# Patient Record
Sex: Female | Born: 1940 | Race: White | Hispanic: No | Marital: Married | Smoking: Never smoker
Health system: Southern US, Community
[De-identification: ages and names within clinical notes are randomized; demographics above are authoritative.]

## PROBLEM LIST (undated history)

## (undated) DIAGNOSIS — E785 Hyperlipidemia, unspecified: Secondary | ICD-10-CM

## (undated) DIAGNOSIS — K219 Gastro-esophageal reflux disease without esophagitis: Secondary | ICD-10-CM

## (undated) DIAGNOSIS — B029 Zoster without complications: Secondary | ICD-10-CM

## (undated) DIAGNOSIS — I10 Essential (primary) hypertension: Secondary | ICD-10-CM

## (undated) DIAGNOSIS — C801 Malignant (primary) neoplasm, unspecified: Secondary | ICD-10-CM

## (undated) DIAGNOSIS — T7840XA Allergy, unspecified, initial encounter: Secondary | ICD-10-CM

## (undated) DIAGNOSIS — K5900 Constipation, unspecified: Secondary | ICD-10-CM

## (undated) DIAGNOSIS — Z923 Personal history of irradiation: Secondary | ICD-10-CM

## (undated) DIAGNOSIS — M1990 Unspecified osteoarthritis, unspecified site: Secondary | ICD-10-CM

## (undated) DIAGNOSIS — F419 Anxiety disorder, unspecified: Secondary | ICD-10-CM

## (undated) HISTORY — DX: Zoster without complications: B02.9

## (undated) HISTORY — DX: Personal history of irradiation: Z92.3

## (undated) HISTORY — DX: Unspecified osteoarthritis, unspecified site: M19.90

## (undated) HISTORY — DX: Essential (primary) hypertension: I10

## (undated) HISTORY — DX: Anxiety disorder, unspecified: F41.9

## (undated) HISTORY — DX: Allergy, unspecified, initial encounter: T78.40XA

## (undated) HISTORY — DX: Hyperlipidemia, unspecified: E78.5

## (undated) HISTORY — DX: Malignant (primary) neoplasm, unspecified: C80.1

## (undated) HISTORY — DX: Constipation, unspecified: K59.00

## (undated) HISTORY — DX: Gastro-esophageal reflux disease without esophagitis: K21.9

---

## 1964-01-04 HISTORY — PX: APPENDECTOMY: SHX54

## 1993-01-03 HISTORY — PX: TOTAL ABDOMINAL HYSTERECTOMY: SHX209

## 1999-01-04 DIAGNOSIS — Z923 Personal history of irradiation: Secondary | ICD-10-CM

## 1999-01-04 HISTORY — DX: Personal history of irradiation: Z92.3

## 1999-01-04 HISTORY — PX: BREAST LUMPECTOMY: SHX2

## 2006-07-31 ENCOUNTER — Encounter: Admission: RE | Admit: 2006-07-31 | Discharge: 2006-07-31 | Payer: Self-pay | Admitting: Internal Medicine

## 2007-08-01 ENCOUNTER — Encounter: Admission: RE | Admit: 2007-08-01 | Discharge: 2007-08-01 | Payer: Self-pay | Admitting: Internal Medicine

## 2008-08-05 ENCOUNTER — Encounter: Admission: RE | Admit: 2008-08-05 | Discharge: 2008-08-05 | Payer: Self-pay | Admitting: Internal Medicine

## 2009-05-11 ENCOUNTER — Ambulatory Visit: Payer: Self-pay | Admitting: Oncology

## 2009-05-11 ENCOUNTER — Ambulatory Visit: Payer: Self-pay | Admitting: Genetic Counselor

## 2009-05-13 LAB — CBC WITH DIFFERENTIAL/PLATELET
BASO%: 0.5 % (ref 0.0–2.0)
EOS%: 1.1 % (ref 0.0–7.0)
Eosinophils Absolute: 0.1 10*3/uL (ref 0.0–0.5)
HCT: 40.4 % (ref 34.8–46.6)
MCHC: 34.7 g/dL (ref 31.5–36.0)
MONO#: 0.6 10*3/uL (ref 0.1–0.9)
MONO%: 8.1 % (ref 0.0–14.0)
Platelets: 218 10*3/uL (ref 145–400)
RBC: 4.33 10*6/uL (ref 3.70–5.45)
WBC: 7.3 10*3/uL (ref 3.9–10.3)
lymph#: 2.2 10*3/uL (ref 0.9–3.3)

## 2009-05-13 LAB — CANCER ANTIGEN 27.29: CA 27.29: 29 U/mL (ref 0–39)

## 2009-08-18 ENCOUNTER — Encounter: Admission: RE | Admit: 2009-08-18 | Discharge: 2009-08-18 | Payer: Self-pay | Admitting: Family Medicine

## 2010-04-23 ENCOUNTER — Other Ambulatory Visit: Payer: Self-pay | Admitting: Family Medicine

## 2010-04-23 DIAGNOSIS — Z1231 Encounter for screening mammogram for malignant neoplasm of breast: Secondary | ICD-10-CM

## 2010-08-23 ENCOUNTER — Ambulatory Visit
Admission: RE | Admit: 2010-08-23 | Discharge: 2010-08-23 | Disposition: A | Discharge status: Home / Self Care | Payer: Medicare Other | Source: Ambulatory Visit | Attending: Family Medicine | Admitting: Family Medicine

## 2010-08-23 DIAGNOSIS — Z1231 Encounter for screening mammogram for malignant neoplasm of breast: Secondary | ICD-10-CM

## 2011-04-20 ENCOUNTER — Other Ambulatory Visit: Payer: Self-pay | Admitting: Family Medicine

## 2011-04-20 DIAGNOSIS — Z1231 Encounter for screening mammogram for malignant neoplasm of breast: Secondary | ICD-10-CM

## 2011-08-24 ENCOUNTER — Ambulatory Visit
Admission: RE | Admit: 2011-08-24 | Discharge: 2011-08-24 | Disposition: A | Discharge status: Home / Self Care | Payer: Medicare Other | Source: Ambulatory Visit | Attending: Family Medicine | Admitting: Family Medicine

## 2011-08-24 DIAGNOSIS — Z1231 Encounter for screening mammogram for malignant neoplasm of breast: Secondary | ICD-10-CM

## 2012-04-17 ENCOUNTER — Other Ambulatory Visit: Payer: Self-pay

## 2012-04-17 DIAGNOSIS — Z1231 Encounter for screening mammogram for malignant neoplasm of breast: Secondary | ICD-10-CM

## 2012-08-28 ENCOUNTER — Ambulatory Visit
Admission: RE | Admit: 2012-08-28 | Discharge: 2012-08-28 | Disposition: A | Discharge status: Home / Self Care | Payer: Medicare Other | Source: Ambulatory Visit

## 2012-08-28 DIAGNOSIS — Z1231 Encounter for screening mammogram for malignant neoplasm of breast: Secondary | ICD-10-CM

## 2013-01-10 ENCOUNTER — Other Ambulatory Visit: Payer: Self-pay | Admitting: Family Medicine

## 2013-01-10 DIAGNOSIS — R7989 Other specified abnormal findings of blood chemistry: Secondary | ICD-10-CM

## 2013-01-10 DIAGNOSIS — R945 Abnormal results of liver function studies: Principal | ICD-10-CM

## 2013-01-14 ENCOUNTER — Ambulatory Visit
Admission: RE | Admit: 2013-01-14 | Discharge: 2013-01-14 | Disposition: A | Discharge status: Home / Self Care | Payer: No Typology Code available for payment source | Source: Ambulatory Visit | Attending: Family Medicine | Admitting: Family Medicine

## 2013-01-14 DIAGNOSIS — R7989 Other specified abnormal findings of blood chemistry: Secondary | ICD-10-CM

## 2013-01-14 DIAGNOSIS — R945 Abnormal results of liver function studies: Principal | ICD-10-CM

## 2013-01-24 ENCOUNTER — Other Ambulatory Visit: Payer: Self-pay | Admitting: Family Medicine

## 2013-01-24 DIAGNOSIS — R935 Abnormal findings on diagnostic imaging of other abdominal regions, including retroperitoneum: Secondary | ICD-10-CM

## 2013-02-04 ENCOUNTER — Ambulatory Visit
Admission: RE | Admit: 2013-02-04 | Discharge: 2013-02-04 | Disposition: A | Discharge status: Home / Self Care | Payer: No Typology Code available for payment source | Source: Ambulatory Visit | Attending: Family Medicine | Admitting: Family Medicine

## 2013-02-04 DIAGNOSIS — R935 Abnormal findings on diagnostic imaging of other abdominal regions, including retroperitoneum: Secondary | ICD-10-CM

## 2013-02-04 MED ORDER — GADOBENATE DIMEGLUMINE 529 MG/ML IV SOLN
20.0000 mL | Freq: Once | INTRAVENOUS | Status: AC | PRN
  Administered 2013-02-04: 20 mL via INTRAVENOUS

## 2013-03-26 ENCOUNTER — Other Ambulatory Visit: Payer: Self-pay

## 2013-03-26 DIAGNOSIS — Z1231 Encounter for screening mammogram for malignant neoplasm of breast: Secondary | ICD-10-CM

## 2013-04-18 ENCOUNTER — Other Ambulatory Visit: Payer: Self-pay | Admitting: Family Medicine

## 2013-04-18 DIAGNOSIS — M810 Age-related osteoporosis without current pathological fracture: Secondary | ICD-10-CM

## 2013-08-30 ENCOUNTER — Ambulatory Visit
Admission: RE | Admit: 2013-08-30 | Discharge: 2013-08-30 | Disposition: A | Discharge status: Home / Self Care | Payer: Medicare Other | Source: Ambulatory Visit

## 2013-08-30 ENCOUNTER — Ambulatory Visit
Admission: RE | Admit: 2013-08-30 | Discharge: 2013-08-30 | Disposition: A | Discharge status: Home / Self Care | Payer: Medicare Other | Source: Ambulatory Visit | Attending: Family Medicine | Admitting: Family Medicine

## 2013-08-30 DIAGNOSIS — M810 Age-related osteoporosis without current pathological fracture: Secondary | ICD-10-CM

## 2013-08-30 DIAGNOSIS — Z1231 Encounter for screening mammogram for malignant neoplasm of breast: Secondary | ICD-10-CM

## 2014-05-05 ENCOUNTER — Other Ambulatory Visit: Payer: Self-pay

## 2014-05-05 DIAGNOSIS — Z1231 Encounter for screening mammogram for malignant neoplasm of breast: Secondary | ICD-10-CM

## 2014-09-02 ENCOUNTER — Ambulatory Visit
Admission: RE | Admit: 2014-09-02 | Discharge: 2014-09-02 | Disposition: A | Discharge status: Home / Self Care | Payer: Medicare Other | Source: Ambulatory Visit

## 2014-09-02 DIAGNOSIS — Z1231 Encounter for screening mammogram for malignant neoplasm of breast: Secondary | ICD-10-CM

## 2014-09-04 ENCOUNTER — Other Ambulatory Visit: Payer: Self-pay | Admitting: Family Medicine

## 2014-09-04 DIAGNOSIS — R928 Other abnormal and inconclusive findings on diagnostic imaging of breast: Secondary | ICD-10-CM

## 2014-09-12 ENCOUNTER — Other Ambulatory Visit: Payer: Self-pay | Admitting: Family Medicine

## 2014-09-12 ENCOUNTER — Ambulatory Visit
Admission: RE | Admit: 2014-09-12 | Discharge: 2014-09-12 | Disposition: A | Discharge status: Home / Self Care | Payer: Medicare Other | Source: Ambulatory Visit | Attending: Family Medicine | Admitting: Family Medicine

## 2014-09-12 DIAGNOSIS — R928 Other abnormal and inconclusive findings on diagnostic imaging of breast: Secondary | ICD-10-CM

## 2014-09-12 DIAGNOSIS — N6320 Unspecified lump in the left breast, unspecified quadrant: Secondary | ICD-10-CM

## 2014-09-19 ENCOUNTER — Ambulatory Visit
Admission: RE | Admit: 2014-09-19 | Discharge: 2014-09-19 | Disposition: A | Discharge status: Home / Self Care | Payer: Medicare Other | Source: Ambulatory Visit | Attending: Family Medicine | Admitting: Family Medicine

## 2014-09-19 ENCOUNTER — Other Ambulatory Visit: Payer: Self-pay | Admitting: Family Medicine

## 2014-09-19 DIAGNOSIS — N6320 Unspecified lump in the left breast, unspecified quadrant: Secondary | ICD-10-CM

## 2014-09-23 ENCOUNTER — Other Ambulatory Visit: Payer: Self-pay | Admitting: Oncology

## 2015-02-09 ENCOUNTER — Other Ambulatory Visit: Payer: Self-pay | Admitting: Family Medicine

## 2015-02-09 DIAGNOSIS — N6320 Unspecified lump in the left breast, unspecified quadrant: Secondary | ICD-10-CM

## 2015-02-16 DIAGNOSIS — E785 Hyperlipidemia, unspecified: Secondary | ICD-10-CM | POA: Diagnosis not present

## 2015-02-16 DIAGNOSIS — I10 Essential (primary) hypertension: Secondary | ICD-10-CM | POA: Diagnosis not present

## 2015-02-16 DIAGNOSIS — E782 Mixed hyperlipidemia: Secondary | ICD-10-CM | POA: Diagnosis not present

## 2015-02-16 DIAGNOSIS — Z8542 Personal history of malignant neoplasm of other parts of uterus: Secondary | ICD-10-CM | POA: Diagnosis not present

## 2015-02-16 DIAGNOSIS — Z853 Personal history of malignant neoplasm of breast: Secondary | ICD-10-CM | POA: Diagnosis not present

## 2015-03-23 ENCOUNTER — Ambulatory Visit
Admission: RE | Admit: 2015-03-23 | Discharge: 2015-03-23 | Disposition: A | Discharge status: Home / Self Care | Payer: Medicare Other | Source: Ambulatory Visit | Attending: Family Medicine | Admitting: Family Medicine

## 2015-03-23 ENCOUNTER — Other Ambulatory Visit: Payer: Self-pay | Admitting: Family Medicine

## 2015-03-23 DIAGNOSIS — N6320 Unspecified lump in the left breast, unspecified quadrant: Secondary | ICD-10-CM

## 2015-03-23 DIAGNOSIS — N63 Unspecified lump in breast: Secondary | ICD-10-CM | POA: Diagnosis not present

## 2015-03-26 ENCOUNTER — Other Ambulatory Visit: Payer: Medicare Other

## 2015-03-31 ENCOUNTER — Ambulatory Visit
Admission: RE | Admit: 2015-03-31 | Discharge: 2015-03-31 | Disposition: A | Discharge status: Home / Self Care | Payer: Medicare Other | Source: Ambulatory Visit | Attending: Family Medicine | Admitting: Family Medicine

## 2015-03-31 ENCOUNTER — Other Ambulatory Visit: Payer: Self-pay | Admitting: Family Medicine

## 2015-03-31 DIAGNOSIS — N6320 Unspecified lump in the left breast, unspecified quadrant: Secondary | ICD-10-CM

## 2015-03-31 DIAGNOSIS — N63 Unspecified lump in breast: Secondary | ICD-10-CM | POA: Diagnosis not present

## 2015-03-31 DIAGNOSIS — N641 Fat necrosis of breast: Secondary | ICD-10-CM | POA: Diagnosis not present

## 2015-08-19 ENCOUNTER — Other Ambulatory Visit: Payer: Self-pay | Admitting: Family Medicine

## 2015-08-19 DIAGNOSIS — N6320 Unspecified lump in the left breast, unspecified quadrant: Secondary | ICD-10-CM

## 2015-08-20 DIAGNOSIS — Z0000 Encounter for general adult medical examination without abnormal findings: Secondary | ICD-10-CM | POA: Diagnosis not present

## 2015-08-20 DIAGNOSIS — E785 Hyperlipidemia, unspecified: Secondary | ICD-10-CM | POA: Diagnosis not present

## 2015-08-20 DIAGNOSIS — R7301 Impaired fasting glucose: Secondary | ICD-10-CM | POA: Diagnosis not present

## 2015-08-20 DIAGNOSIS — I10 Essential (primary) hypertension: Secondary | ICD-10-CM | POA: Diagnosis not present

## 2015-08-20 DIAGNOSIS — Z8542 Personal history of malignant neoplasm of other parts of uterus: Secondary | ICD-10-CM | POA: Diagnosis not present

## 2015-08-20 DIAGNOSIS — F411 Generalized anxiety disorder: Secondary | ICD-10-CM | POA: Diagnosis not present

## 2015-08-20 DIAGNOSIS — Z853 Personal history of malignant neoplasm of breast: Secondary | ICD-10-CM | POA: Diagnosis not present

## 2015-08-21 ENCOUNTER — Ambulatory Visit
Admission: RE | Admit: 2015-08-21 | Discharge: 2015-08-21 | Disposition: A | Discharge status: Home / Self Care | Payer: Medicare Other | Source: Ambulatory Visit | Attending: Family Medicine | Admitting: Family Medicine

## 2015-08-21 ENCOUNTER — Other Ambulatory Visit: Payer: Self-pay | Admitting: Family Medicine

## 2015-08-21 DIAGNOSIS — I10 Essential (primary) hypertension: Secondary | ICD-10-CM

## 2015-08-21 DIAGNOSIS — R918 Other nonspecific abnormal finding of lung field: Secondary | ICD-10-CM | POA: Diagnosis not present

## 2015-10-02 ENCOUNTER — Ambulatory Visit
Admission: RE | Admit: 2015-10-02 | Discharge: 2015-10-02 | Disposition: A | Discharge status: Home / Self Care | Payer: Medicare Other | Source: Ambulatory Visit | Attending: Family Medicine | Admitting: Family Medicine

## 2015-10-02 DIAGNOSIS — R928 Other abnormal and inconclusive findings on diagnostic imaging of breast: Secondary | ICD-10-CM | POA: Diagnosis not present

## 2015-10-02 DIAGNOSIS — N6320 Unspecified lump in the left breast, unspecified quadrant: Secondary | ICD-10-CM

## 2015-10-14 DIAGNOSIS — Z23 Encounter for immunization: Secondary | ICD-10-CM | POA: Diagnosis not present

## 2015-11-04 DIAGNOSIS — H353131 Nonexudative age-related macular degeneration, bilateral, early dry stage: Secondary | ICD-10-CM | POA: Diagnosis not present

## 2015-11-04 DIAGNOSIS — H524 Presbyopia: Secondary | ICD-10-CM | POA: Diagnosis not present

## 2015-11-04 DIAGNOSIS — H26493 Other secondary cataract, bilateral: Secondary | ICD-10-CM | POA: Diagnosis not present

## 2016-02-23 DIAGNOSIS — M79671 Pain in right foot: Secondary | ICD-10-CM | POA: Diagnosis not present

## 2016-02-23 DIAGNOSIS — I10 Essential (primary) hypertension: Secondary | ICD-10-CM | POA: Diagnosis not present

## 2016-02-23 DIAGNOSIS — R7301 Impaired fasting glucose: Secondary | ICD-10-CM | POA: Diagnosis not present

## 2016-02-23 DIAGNOSIS — Z853 Personal history of malignant neoplasm of breast: Secondary | ICD-10-CM | POA: Diagnosis not present

## 2016-02-23 DIAGNOSIS — H3530 Unspecified macular degeneration: Secondary | ICD-10-CM | POA: Diagnosis not present

## 2016-02-23 DIAGNOSIS — E785 Hyperlipidemia, unspecified: Secondary | ICD-10-CM | POA: Diagnosis not present

## 2016-02-23 DIAGNOSIS — Z8542 Personal history of malignant neoplasm of other parts of uterus: Secondary | ICD-10-CM | POA: Diagnosis not present

## 2016-02-24 ENCOUNTER — Ambulatory Visit
Admission: RE | Admit: 2016-02-24 | Discharge: 2016-02-24 | Disposition: A | Discharge status: Home / Self Care | Payer: Medicare Other | Source: Ambulatory Visit | Attending: Family Medicine | Admitting: Family Medicine

## 2016-02-24 ENCOUNTER — Other Ambulatory Visit: Payer: Self-pay | Admitting: Family Medicine

## 2016-02-24 DIAGNOSIS — M79671 Pain in right foot: Secondary | ICD-10-CM

## 2016-06-01 DIAGNOSIS — I10 Essential (primary) hypertension: Secondary | ICD-10-CM | POA: Diagnosis not present

## 2016-06-01 DIAGNOSIS — M109 Gout, unspecified: Secondary | ICD-10-CM | POA: Diagnosis not present

## 2016-07-11 DIAGNOSIS — M25562 Pain in left knee: Secondary | ICD-10-CM | POA: Diagnosis not present

## 2016-07-12 DIAGNOSIS — M179 Osteoarthritis of knee, unspecified: Secondary | ICD-10-CM | POA: Diagnosis not present

## 2016-07-12 DIAGNOSIS — I10 Essential (primary) hypertension: Secondary | ICD-10-CM | POA: Diagnosis not present

## 2016-07-12 DIAGNOSIS — J302 Other seasonal allergic rhinitis: Secondary | ICD-10-CM | POA: Diagnosis not present

## 2016-07-12 DIAGNOSIS — F321 Major depressive disorder, single episode, moderate: Secondary | ICD-10-CM | POA: Diagnosis not present

## 2016-07-12 DIAGNOSIS — M109 Gout, unspecified: Secondary | ICD-10-CM | POA: Diagnosis not present

## 2016-07-12 DIAGNOSIS — Z1389 Encounter for screening for other disorder: Secondary | ICD-10-CM | POA: Diagnosis not present

## 2016-07-12 DIAGNOSIS — Z8542 Personal history of malignant neoplasm of other parts of uterus: Secondary | ICD-10-CM | POA: Diagnosis not present

## 2016-07-12 DIAGNOSIS — E7800 Pure hypercholesterolemia, unspecified: Secondary | ICD-10-CM | POA: Diagnosis not present

## 2016-07-12 DIAGNOSIS — Z6835 Body mass index (BMI) 35.0-35.9, adult: Secondary | ICD-10-CM | POA: Diagnosis not present

## 2016-07-13 ENCOUNTER — Encounter: Payer: Self-pay | Admitting: Internal Medicine

## 2016-07-14 ENCOUNTER — Other Ambulatory Visit: Payer: Self-pay | Admitting: Internal Medicine

## 2016-07-14 DIAGNOSIS — Z1231 Encounter for screening mammogram for malignant neoplasm of breast: Secondary | ICD-10-CM

## 2016-07-29 DIAGNOSIS — M25562 Pain in left knee: Secondary | ICD-10-CM | POA: Diagnosis not present

## 2016-09-07 ENCOUNTER — Ambulatory Visit (AMBULATORY_SURGERY_CENTER): Payer: Self-pay | Admitting: *Deleted

## 2016-09-07 VITALS — Ht 66.0 in | Wt 215.4 lb

## 2016-09-07 DIAGNOSIS — Z1211 Encounter for screening for malignant neoplasm of colon: Secondary | ICD-10-CM

## 2016-09-07 MED ORDER — NA SULFATE-K SULFATE-MG SULF 17.5-3.13-1.6 GM/177ML PO SOLN: ORAL | 0 refills | Status: DC

## 2016-09-07 NOTE — Progress Notes (Signed)
No allergies to eggs or soy. No problems with anesthesia.  Pt not given Emmi instructions for colonoscopy; pt declined  No oxygen use  No diet drug use

## 2016-09-21 ENCOUNTER — Encounter: Payer: Self-pay | Admitting: Gastroenterology

## 2016-09-21 ENCOUNTER — Ambulatory Visit (AMBULATORY_SURGERY_CENTER): Payer: Medicare Other | Admitting: Gastroenterology

## 2016-09-21 VITALS — BP 142/64 | HR 65 | Temp 98.0°F | Resp 13 | Ht 66.0 in | Wt 215.0 lb

## 2016-09-21 DIAGNOSIS — Z1211 Encounter for screening for malignant neoplasm of colon: Secondary | ICD-10-CM

## 2016-09-21 DIAGNOSIS — D124 Benign neoplasm of descending colon: Secondary | ICD-10-CM

## 2016-09-21 DIAGNOSIS — I10 Essential (primary) hypertension: Secondary | ICD-10-CM | POA: Diagnosis not present

## 2016-09-21 DIAGNOSIS — D122 Benign neoplasm of ascending colon: Secondary | ICD-10-CM | POA: Diagnosis not present

## 2016-09-21 DIAGNOSIS — K635 Polyp of colon: Secondary | ICD-10-CM

## 2016-09-21 DIAGNOSIS — K5730 Diverticulosis of large intestine without perforation or abscess without bleeding: Secondary | ICD-10-CM

## 2016-09-21 DIAGNOSIS — D125 Benign neoplasm of sigmoid colon: Secondary | ICD-10-CM

## 2016-09-21 MED ORDER — SODIUM CHLORIDE 0.9 % IV SOLN: 500.0000 mL | INTRAVENOUS | Status: DC

## 2016-09-21 NOTE — Op Note (Signed)
[place] Patient Name: Kiara Rose Procedure Date: 09/21/2016 9:47 AM MRN: [id number] Endoscopist: Milus Banister , MD Age: 76 Referring MD:  Date of Birth: Jun 29, 1940 Gender: Female Account #: [id number] Procedure:                Colonoscopy Indications:              Screening for colorectal malignant neoplasm Medicines:                Monitored Anesthesia Care Procedure:                Pre-Anesthesia Assessment:                           - Prior to the procedure, a History and Physical                            was performed, and patient medications and                            allergies were reviewed. The patient's tolerance of                            previous anesthesia was also reviewed. The risks                            and benefits of the procedure and the sedation                            options and risks were discussed with the patient.                            All questions were answered, and informed consent                            was obtained. Prior Anticoagulants: The patient has                            taken no previous anticoagulant or antiplatelet                            agents. ASA Grade Assessment: II - A patient with                            mild systemic disease. After reviewing the risks                            and benefits, the patient was deemed in                            satisfactory condition to undergo the procedure.                           After obtaining informed consent, the colonoscope  was passed under direct vision. Throughout the                            procedure, the patient's blood pressure, pulse, and                            oxygen saturations were monitored continuously. The                            Colonoscope was introduced through the anus and                            advanced to the the cecum, identified by                            appendiceal orifice and  ileocecal valve. The                            colonoscopy was performed without difficulty. The                            patient tolerated the procedure well. The quality                            of the bowel preparation was good. The ileocecal                            valve, appendiceal orifice, and rectum were                            photographed. Scope In: 9:49:33 AM Scope Out: 10:08:32 AM Scope Withdrawal Time: 0 hours 14 minutes 49 seconds  Total Procedure Duration: 0 hours 18 minutes 59 seconds  Findings:                 Seven sessile polyps were found in the sigmoid                            colon, descending colon and ascending colon. The                            polyps were 3 to 7 mm in size. These polyps were                            removed with a cold snare. Resection and retrieval                            were complete.                           Multiple small and large-mouthed diverticula were                            found in the left colon.  The exam was otherwise without abnormality on                            direct and retroflexion views. Complications:            No immediate complications. Estimated blood loss:                            None. Estimated Blood Loss:     Estimated blood loss: none. Impression:               - Seven 3 to 7 mm polyps in the sigmoid colon, in                            the descending colon and in the ascending colon,                            removed with a cold snare. Resected and retrieved.                           - Diverticulosis in the left colon.                           - The examination was otherwise normal on direct                            and retroflexion views. Recommendation:           - Patient has a contact number available for                            emergencies. The signs and symptoms of potential                            delayed complications were discussed with  the                            patient. Return to normal activities tomorrow.                            Written discharge instructions were provided to the                            patient.                           - Resume previous diet.                           - Continue present medications.                           You will receive a letter within 2-3 weeks with the                            pathology results and my final recommendations.  If the polyp(s) is proven to be 'pre-cancerous' on                            pathology, you will need repeat colonoscopy in 3-5                            years. If the polyp(s) is NOT 'precancerous' on                            pathology then you should repeat colon cancer                            screening in 10 years with colonoscopy without need                            for colon cancer screening by any method prior to                            then (including stool testing). Milus Banister, MD 09/21/2016 10:10:54 AM This report has been signed electronically.

## 2016-09-21 NOTE — Progress Notes (Signed)
Report to PACU, RN, vss, BBS= Clear.  

## 2016-09-21 NOTE — Progress Notes (Signed)
Pt's states no medical or surgical changes since previsit  

## 2016-09-21 NOTE — Progress Notes (Signed)
Called to room to assist during endoscopic procedure.  Patient ID and intended procedure confirmed with present staff. Received instructions for my participation in the procedure from the performing physician.  

## 2016-09-21 NOTE — Patient Instructions (Signed)
YOU HAD AN ENDOSCOPIC PROCEDURE TODAY AT [facility]:   Refer to the procedure report that was given to you for any specific questions about what was found during the examination.  If the procedure report does not answer your questions, please call your gastroenterologist to clarify.  If you requested that your care partner not be given the details of your procedure findings, then the procedure report has been included in a sealed envelope for you to review at your convenience later.  YOU SHOULD EXPECT: Some feelings of bloating in the abdomen. Passage of more gas than usual.  Walking can help get rid of the air that was put into your GI tract during the procedure and reduce the bloating. If you had a lower endoscopy (such as a colonoscopy or flexible sigmoidoscopy) you may notice spotting of blood in your stool or on the toilet paper. If you underwent a bowel prep for your procedure, you may not have a normal bowel movement for a few days.  Please Note:  You might notice some irritation and congestion in your nose or some drainage.  This is from the oxygen used during your procedure.  There is no need for concern and it should clear up in a day or so.  SYMPTOMS TO REPORT IMMEDIATELY:   Following lower endoscopy (colonoscopy or flexible sigmoidoscopy):  Excessive amounts of blood in the stool  Significant tenderness or worsening of abdominal pains  Swelling of the abdomen that is new, acute  Fever of 100F or higher    For urgent or emergent issues, a gastroenterologist can be reached at any hour by calling [phone].   DIET:  We do recommend a small meal at first, but then you may proceed to your regular diet.  Drink plenty of fluids but you should avoid alcoholic beverages for 24 hours.  ACTIVITY:  You should plan to take it easy for the rest of today and you should NOT DRIVE or use heavy machinery until tomorrow (because of the sedation medicines used during the test).     FOLLOW UP: Our staff will call the number listed on your records the next business day following your procedure to check on you and address any questions or concerns that you may have regarding the information given to you following your procedure. If we do not reach you, we will leave a message.  However, if you are feeling well and you are not experiencing any problems, there is no need to return our call.  We will assume that you have returned to your regular daily activities without incident.  If any biopsies were taken you will be contacted by phone or by letter within the next 1-3 weeks.  Please call us at [phone] if you have not heard about the biopsies in 3 weeks.    SIGNATURES/CONFIDENTIALITY: You and/or your care partner have signed paperwork which will be entered into your electronic medical record.  These signatures attest to the fact that that the information abover.  Polyp and diverticulosis information given.

## 2016-09-22 ENCOUNTER — Telehealth: Payer: Self-pay

## 2016-09-22 NOTE — Telephone Encounter (Signed)
  Follow up Call-  Call back number 09/21/2016  Post procedure Call Back phone  # [phone] home   Permission to leave phone message Yes  Some recent data might be hidden    [phone] Correct number!!!  Patient questions:  Do you have a fever, pain , or abdominal swelling? No. Pain Score  0 *  Have you tolerated food without any problems? Yes.    Have you been able to return to your normal activities? Yes.    Do you have any questions about your discharge instructions: Diet   No. Medications  No. Follow up visit  No.  Do you have questions or concerns about your Care? No.  Actions: * If pain score is 4 or above: No action needed, pain <4.

## 2016-09-27 ENCOUNTER — Encounter: Payer: Self-pay | Admitting: Gastroenterology

## 2016-10-04 ENCOUNTER — Ambulatory Visit
Admission: RE | Admit: 2016-10-04 | Discharge: 2016-10-04 | Disposition: A | Discharge status: Home / Self Care | Payer: Medicare Other | Source: Ambulatory Visit | Attending: Internal Medicine | Admitting: Internal Medicine

## 2016-10-04 DIAGNOSIS — Z1231 Encounter for screening mammogram for malignant neoplasm of breast: Secondary | ICD-10-CM | POA: Diagnosis not present

## 2016-11-03 DIAGNOSIS — H353131 Nonexudative age-related macular degeneration, bilateral, early dry stage: Secondary | ICD-10-CM | POA: Diagnosis not present

## 2016-11-03 DIAGNOSIS — H524 Presbyopia: Secondary | ICD-10-CM | POA: Diagnosis not present

## 2016-11-03 DIAGNOSIS — H26491 Other secondary cataract, right eye: Secondary | ICD-10-CM | POA: Diagnosis not present

## 2016-11-09 DIAGNOSIS — R82998 Other abnormal findings in urine: Secondary | ICD-10-CM | POA: Diagnosis not present

## 2016-11-09 DIAGNOSIS — M109 Gout, unspecified: Secondary | ICD-10-CM | POA: Diagnosis not present

## 2016-11-09 DIAGNOSIS — I10 Essential (primary) hypertension: Secondary | ICD-10-CM | POA: Diagnosis not present

## 2016-11-09 DIAGNOSIS — Z8542 Personal history of malignant neoplasm of other parts of uterus: Secondary | ICD-10-CM | POA: Diagnosis not present

## 2016-11-09 DIAGNOSIS — E7800 Pure hypercholesterolemia, unspecified: Secondary | ICD-10-CM | POA: Diagnosis not present

## 2016-11-15 DIAGNOSIS — H353132 Nonexudative age-related macular degeneration, bilateral, intermediate dry stage: Secondary | ICD-10-CM | POA: Diagnosis not present

## 2016-11-16 DIAGNOSIS — Z0000 Encounter for general adult medical examination without abnormal findings: Secondary | ICD-10-CM | POA: Diagnosis not present

## 2016-11-16 DIAGNOSIS — M109 Gout, unspecified: Secondary | ICD-10-CM | POA: Diagnosis not present

## 2016-11-16 DIAGNOSIS — F321 Major depressive disorder, single episode, moderate: Secondary | ICD-10-CM | POA: Diagnosis not present

## 2016-11-16 DIAGNOSIS — Z23 Encounter for immunization: Secondary | ICD-10-CM | POA: Diagnosis not present

## 2016-11-16 DIAGNOSIS — M1712 Unilateral primary osteoarthritis, left knee: Secondary | ICD-10-CM | POA: Diagnosis not present

## 2016-11-16 DIAGNOSIS — Z6835 Body mass index (BMI) 35.0-35.9, adult: Secondary | ICD-10-CM | POA: Diagnosis not present

## 2016-11-16 DIAGNOSIS — J302 Other seasonal allergic rhinitis: Secondary | ICD-10-CM | POA: Diagnosis not present

## 2016-11-16 DIAGNOSIS — Z1389 Encounter for screening for other disorder: Secondary | ICD-10-CM | POA: Diagnosis not present

## 2016-11-16 DIAGNOSIS — E7800 Pure hypercholesterolemia, unspecified: Secondary | ICD-10-CM | POA: Diagnosis not present

## 2016-11-16 DIAGNOSIS — Z8542 Personal history of malignant neoplasm of other parts of uterus: Secondary | ICD-10-CM | POA: Diagnosis not present

## 2016-11-16 DIAGNOSIS — I10 Essential (primary) hypertension: Secondary | ICD-10-CM | POA: Diagnosis not present

## 2016-11-17 DIAGNOSIS — H26492 Other secondary cataract, left eye: Secondary | ICD-10-CM | POA: Diagnosis not present

## 2016-11-23 ENCOUNTER — Telehealth: Payer: Self-pay | Admitting: Oncology

## 2016-11-23 NOTE — Telephone Encounter (Signed)
Spoke with patient re 12/3 lab and new patient appointment with [facility]. Confirmed date/time/location/demographics/insurance. Former [facility] patient.

## 2016-11-23 NOTE — Telephone Encounter (Signed)
Also left message for [facility] at [facility] re 12/3 appointment and requested that if she had not done so already to fax records requested by new patient scheduler as soon as possible before patient's appointment.

## 2016-11-29 DIAGNOSIS — Z1212 Encounter for screening for malignant neoplasm of rectum: Secondary | ICD-10-CM | POA: Diagnosis not present

## 2016-11-30 ENCOUNTER — Encounter: Payer: Self-pay | Admitting: *Deleted

## 2016-11-30 DIAGNOSIS — T7840XA Allergy, unspecified, initial encounter: Secondary | ICD-10-CM | POA: Insufficient documentation

## 2016-11-30 DIAGNOSIS — M1990 Unspecified osteoarthritis, unspecified site: Secondary | ICD-10-CM | POA: Insufficient documentation

## 2016-11-30 DIAGNOSIS — C801 Malignant (primary) neoplasm, unspecified: Secondary | ICD-10-CM | POA: Insufficient documentation

## 2016-11-30 DIAGNOSIS — F419 Anxiety disorder, unspecified: Secondary | ICD-10-CM | POA: Insufficient documentation

## 2016-11-30 DIAGNOSIS — I10 Essential (primary) hypertension: Secondary | ICD-10-CM | POA: Insufficient documentation

## 2016-11-30 DIAGNOSIS — E785 Hyperlipidemia, unspecified: Secondary | ICD-10-CM | POA: Insufficient documentation

## 2016-11-30 NOTE — Progress Notes (Signed)
[place]  Telephone:([phone]) [phone] Fax:(336) [phone]     ID: Kiara Rose DOB: 01/09/1940  MR#: [id number]  IWP#:[id number]  Patient Care Team: Haywood Pao, MD as PCP - General (Internal Medicine) Rubie Ficco, Virgie Dad, MD as Consulting Physician (Oncology) Garlan Fair, MD as Consulting Physician (Gastroenterology) Melrose Nakayama, MD as Consulting Physician (Orthopedic Surgery)  OTHER MD:  CHIEF COMPLAINT: Elevated CA-27-29, history of breast cancer  CURRENT TREATMENT: Workup in progress   HISTORY OF CURRENT ILLNESS:  [name] has a history of left breast cancer dating back to 2001.  This was a stage I tumor, estrogen receptor positive, and after radiation she received tamoxifen for 2-1/2 years and letrozole for 2-1/2 years.  (This is to the best of our recollection.  I have not been able to re-access her old data).  She also has a history of remote uterine cancer, status post TAH/BSO.  More recently she had a CA-44-29 obtained by Dr. Osborne Casco which was elevated at 41.4.  She was referred back for further evaluation.  She had routine bilateral screening mammography with CAD and tomography on 10/04/2016 at [place] showing: Breast Density category B. There was no evidence of malignancy.  On 09/21/2016, she underwent colonoscopy with colon biopsies ([id number]) of the descending,  and ascending-and sigmoid colons, a total of, a 7 polyps checked, none with high-grade dysplasia or malignancy.   The patient's subsequent history is as detailed below.  INTERVAL HISTORY: Thedora was evaluated in the breast clinic 12/05/2016.  REVIEW OF SYSTEMS: Electa reports that she has been well overall. She reports that she had some foot gout and had an injection by Dr. Osborne Casco. She denies unusual headaches, visual changes, nausea, vomiting, or dizziness. There has been no unusual cough, phlegm production, or pleurisy. This been no change in bowel or bladder habits.  She denies unexplained fatigue or unexplained weight loss, bleeding, rash, or fever.  There have been no breast changes that she has noted.  A detailed review of systems was otherwise stable.    PAST MEDICAL HISTORY: Past Medical History:  Diagnosis Date  . Allergy   . Anxiety   . Arthritis   . Cancer ([place]) 2001, 1995   uterine cancer, 1995, breast cancer 2001  . Hyperlipidemia   . Hypertension     PAST SURGICAL HISTORY: Past Surgical History:  Procedure Laterality Date  . BREAST LUMPECTOMY Left 2001  . TOTAL ABDOMINAL HYSTERECTOMY  1995    FAMILY HISTORY Family History  Problem Relation Age of Onset  . Colon cancer Neg Hx     Her father (deceased at 21) had allergies and asthma, diabetes, and heart attack, HTN, and kidney stones. Her mother passed away at 51 with neurological problems and stroke. She has no siblings. No one in the family has a history of ovarian or breast cancer, that she knows of.  GYNECOLOGIC HISTORY:  No LMP recorded. Patient is postmenopausal.  She had a hysterectomy in 1995 at age 11 due to uterine cancer.   Menarche: 76 y.o. 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 She lives with her husband, Derrill Rose who is 77. He is hard of hearing. He is physically a Scientist, research (physical sciences). Her oldest son, Aaron Edelman, works with Dr.Stern. Her daughter Joelene Millin is an office tech at the same doctor's office.  The patient has 1 grandson  who is 20.  She attends a local [employer]: Social History   Tobacco Use  . Smoking status: Never Smoker  . Smokeless tobacco: Never Used  Substance Use Topics  . Alcohol use: No  . Drug use: No     Colonoscopy: last 09/21/2016  PAP:  Bone density:   No Known Allergies  Current Outpatient Medications  Medication Sig Dispense Refill  . allopurinol (ZYLOPRIM)  100 MG tablet Take 100 mg by mouth daily. Takes 2 tablets once daily    . aspirin 325 MG tablet Take 325 mg by mouth daily.    . calcium-vitamin D 250-100 MG-UNIT tablet Take 1 tablet by mouth 2 (two) times daily.    . citalopram (CELEXA) 20 MG tablet Take 20 mg by mouth daily.    . fexofenadine (ALLEGRA) 180 MG tablet Take 180 mg by mouth daily.    . fluticasone (FLONASE) 50 MCG/ACT nasal spray Place into both nostrils daily.    . furosemide (LASIX) 20 MG tablet Take 20 mg by mouth daily.    Marland Kitchen ibuprofen (ADVIL,MOTRIN) 800 MG tablet Take 800 mg by mouth every 8 (eight) hours as needed.    . irbesartan-hydrochlorothiazide (AVALIDE) 300-12.5 MG tablet Take 1 tablet by mouth daily.    . montelukast (SINGULAIR) 10 MG tablet Take 10 mg by mouth at bedtime.    . Multiple Vitamins-Minerals (EYE VITAMINS PO) Take by mouth 2 (two) times daily. MacProtect eye vitamin    . Omega-3 Fatty Acids (FISH OIL) 1200 MG CAPS Take by mouth 2 (two) times daily.    . potassium chloride SA (K-DUR,KLOR-CON) 20 MEQ tablet Take 20 mEq by mouth 2 (two) times daily.    . simvastatin (ZOCOR) 40 MG tablet Take 40 mg by mouth daily.     No current facility-administered medications for this visit.     OBJECTIVE: Older white woman in no acute distress  Vitals:   12/05/16 1439  BP: (!) 147/93  Pulse: 67  Resp: 18  Temp: 98.4 F (36.9 C)  SpO2: 100%     Body mass index is 34.88 kg/m.   Wt Readings from Last 3 Encounters:  12/05/16 216 lb 1.6 oz (98 kg)  09/21/16 215 lb (97.5 kg)  09/07/16 215 lb 6.4 oz (97.7 kg)      ECOG FS:0 - Asymptomatic  Ocular: Sclerae unicteric, pupils round and equal Ear-nose-throat: Oropharynx clear and moist Lymphatic: No cervical or supraclavicular adenopathy Lungs no rales or rhonchi Heart regular rate and rhythm Abd soft, nontender, positive bowel sounds MSK no focal spinal tenderness, no joint edema Neuro: non-focal, well-oriented, appropriate affect Breasts: The right breast  is unremarkable.  The left breast is status post remote lumpectomy and radiation.  There is no evidence of local recurrence.  Both axillae are benign.   LAB RESULTS:  CMP     Component Value Date/Time   NA 139 12/05/2016 1424   K 4.2 12/05/2016 1424   CO2 28 12/05/2016 1424   GLUCOSE 96 12/05/2016 1424   BUN 24.6 12/05/2016 1424   CREATININE 1.1 12/05/2016 1424   CALCIUM 9.4 12/05/2016 1424   PROT 7.0 12/05/2016 1424   ALBUMIN 4.0 12/05/2016 1424   AST 27 12/05/2016 1424   ALT 21 12/05/2016 1424   ALKPHOS 79 12/05/2016 1424   BILITOT 0.37 12/05/2016 1424    No results found for: TOTALPROTELP, ALBUMINELP, A1GS, A2GS, BETS, BETA2SER, GAMS, MSPIKE, SPEI  No results found for: KPAFRELGTCHN, LAMBDASER, KAPLAMBRATIO  Lab Results  Component Value Date   WBC 8.3 12/05/2016   NEUTROABS 5.1 12/05/2016   HGB 13.2 12/05/2016   HCT 38.8 12/05/2016   MCV 96.3 12/05/2016   PLT 206 12/05/2016    _0 @  Lab Results  Component Value Date   LABCA2 29 05/13/2009    No components found for: QBHALP379  No results for input(s): INR in the last 168 hours.  Lab Results  Component Value Date   LABCA2 29 05/13/2009    No results found for: KWI097  No results found for: DZH299  No results found for: MEQ683  No results found for: CA2729  No components found for: HGQUANT  No results found for: CEA1 / No results found for: CEA1   No results found for: AFPTUMOR  No results found for: [place]  No results found for: PSA1  Appointment on 12/05/2016  Component Date Value Ref Range Status  . Sodium 12/05/2016 139  136 - 145 mEq/L Final  . Potassium 12/05/2016 4.2  3.5 - 5.1 mEq/L Final  . Chloride 12/05/2016 102  98 - 109 mEq/L Final  . CO2 12/05/2016 28  22 - 29 mEq/L Final  . Glucose 12/05/2016 96  70 - 140 mg/dl Final   Glucose reference range is for nonfasting patients. Fasting glucose reference range is 70- 100.  Marland Kitchen BUN 12/05/2016 24.6  7.0 - 26.0 mg/dL  Final  . Creatinine 12/05/2016 1.1  0.6 - 1.1 mg/dL Final  . Total Bilirubin 12/05/2016 0.37  0.20 - 1.20 mg/dL Final  . Alkaline Phosphatase 12/05/2016 79  40 - 150 U/L Final  . AST 12/05/2016 27  5 - 34 U/L Final  . ALT 12/05/2016 21  0 - 55 U/L Final  . Total Protein 12/05/2016 7.0  6.4 - 8.3 g/dL Final  . Albumin 12/05/2016 4.0  3.5 - 5.0 g/dL Final  . Calcium 12/05/2016 9.4  8.4 - 10.4 mg/dL Final  . Anion Gap 12/05/2016 9  3 - 11 mEq/L Final  . EGFR 12/05/2016 51* >60 ml/min/1.73 m2 Final   eGFR is calculated using the CKD-EPI Creatinine Equation (2009)  . WBC 12/05/2016 8.3  3.9 - 10.3 10e3/uL Final  . NEUT# 12/05/2016 5.1  1.5 - 6.5 10e3/uL Final  . HGB 12/05/2016 13.2  11.6 - 15.9 g/dL Final  . HCT 12/05/2016 38.8  34.8 - 46.6 % Final  . Platelets 12/05/2016 206  145 - 400 10e3/uL Final  . MCV 12/05/2016 96.3  79.5 - 101.0 fL Final  . MCH 12/05/2016 32.8  25.1 - 34.0 pg Final  . MCHC 12/05/2016 34.0  31.5 - 36.0 g/dL Final  . RBC 12/05/2016 4.03  3.70 - 5.45 10e6/uL Final  . RDW 12/05/2016 13.0  11.2 - 14.5 % Final  . lymph# 12/05/2016 2.3  0.9 - 3.3 10e3/uL Final  . MONO# 12/05/2016 0.7  0.1 - 0.9 10e3/uL Final  . Eosinophils Absolute 12/05/2016 0.2  0.0 - 0.5 10e3/uL Final  . Basophils Absolute 12/05/2016 0.0  0.0 - 0.1 10e3/uL Final  . NEUT% 12/05/2016 61.2  38.4 - 76.8 % Final  . LYMPH% 12/05/2016 27.6  14.0 - 49.7 % Final  . MONO% 12/05/2016 8.4  0.0 - 14.0 % Final  . EOS% 12/05/2016 2.4  0.0 - 7.0 % Final  . BASO% 12/05/2016 0.4  0.0 - 2.0 % Final    (this displays the last labs from the last 3 days)  No results found for: TOTALPROTELP, ALBUMINELP, A1GS, A2GS, BETS, BETA2SER, [place], MSPIKE,  SPEI (this displays SPEP labs)  No results found for: KPAFRELGTCHN, LAMBDASER, KAPLAMBRATIO (kappa/lambda light chains)  No results found for: HGBA, HGBA2QUANT, HGBFQUANT, HGBSQUAN (Hemoglobinopathy evaluation)   No results found for: LDH  No results found for: IRON,  TIBC, IRONPCTSAT (Iron and TIBC)  No results found for: FERRITIN  Urinalysis No results found for: COLORURINE, APPEARANCEUR, LABSPEC, PHURINE, GLUCOSEU, HGBUR, BILIRUBINUR, KETONESUR, PROTEINUR, UROBILINOGEN, NITRITE, LEUKOCYTESUR   STUDIES: Recent mammographic results reviewed with the patient  ELIGIBLE FOR AVAILABLE RESEARCH PROTOCOL:   ASSESSMENT: 76 y.o. Welcome woman  (1) s/p TAH-BSO for uterine cancer, remote  (2) s/p left lumpectomy [2001?] for a stage I invasive breast cancer, estrogen receptor positive, s/p radiation and 5 years of anti-estrogens (2.5 years each tamoxifen and letrozole)  (3) CA 27-29 slightly elevated November 2018 (41.4)  PLAN: I spent approximately 30 minutes with Thayer Headings with most of that time spent discussing her current situation..   I was not able to obtain our old records on her but have requested them from our medical records department.  Frequently after 10 years the records are no longer available.  By her recollection, Emmajean had an early stage endometrial cancer which was likely cured with her surgery, and she had an early stage estrogen receptor positive left-sided breast cancer, treated with surgery, radiation, and antiestrogens.  She now has a slightly elevated CA 27-29.  This is an antigen associated with breast cancer.  It is not associated with uterine cancer.  Its actual function in the normal physiology is not well understood.  Men also make CA-27-29's and doubtless the blood level goes up and down depending on poorly understood bodily changes.  For this reason we have stopped checking this antigen in breast cancer, except for stage IV cases.  In stage IV some patients have no measurable disease (bone metastases only) and the CA-27-29 can provide an indirect measure of response or progression.  It is not sufficiently accurate and therefore it always needs to be confirmed by other means.  I suspect Kellsie's slightly elevated  CA-27-29 will return to normal when rechecked (this was done today, but I do not yet have the results).  If it is higher we will proceed to staging studies.  If it is back to normal no further evaluation will be needed and probably I would stop checking this particular marker in this patient.  If it is about the same we will repeat it in 3 months  Nevayah has a good understanding of the overall plan. She agrees with it.  Pending the repeat test results I have made no further appointments for her here, but she knows that I will be glad to see her any point in the future as needed.  Ramesses Crampton, Virgie Dad, MD  12/05/16 5:05 PM Medical Oncology and Hematology [facility] [address] [place], [place] [address] Tel. [phone]    Fax. [phone]  This document serves as a record of services personally performed by Lurline Del, MD. It was created on his behalf by Sheron Nightingale, a trained medical scribe. The creation of this record is based on the scribe's personal observations and the provider's statements to them.   I have reviewed the above documentation for accuracy and completeness, and I agree with the above.

## 2016-12-02 ENCOUNTER — Other Ambulatory Visit: Payer: Self-pay | Admitting: *Deleted

## 2016-12-02 DIAGNOSIS — C801 Malignant (primary) neoplasm, unspecified: Secondary | ICD-10-CM

## 2016-12-05 ENCOUNTER — Other Ambulatory Visit (HOSPITAL_BASED_OUTPATIENT_CLINIC_OR_DEPARTMENT_OTHER): Payer: Medicare Other

## 2016-12-05 ENCOUNTER — Ambulatory Visit (HOSPITAL_BASED_OUTPATIENT_CLINIC_OR_DEPARTMENT_OTHER): Payer: Medicare Other | Admitting: Oncology

## 2016-12-05 ENCOUNTER — Telehealth: Payer: Self-pay | Admitting: Oncology

## 2016-12-05 DIAGNOSIS — C801 Malignant (primary) neoplasm, unspecified: Secondary | ICD-10-CM

## 2016-12-05 DIAGNOSIS — C50922 Malignant neoplasm of unspecified site of left male breast: Secondary | ICD-10-CM

## 2016-12-05 DIAGNOSIS — Z853 Personal history of malignant neoplasm of breast: Secondary | ICD-10-CM

## 2016-12-05 DIAGNOSIS — Z8542 Personal history of malignant neoplasm of other parts of uterus: Secondary | ICD-10-CM

## 2016-12-05 DIAGNOSIS — C50912 Malignant neoplasm of unspecified site of left female breast: Secondary | ICD-10-CM | POA: Diagnosis not present

## 2016-12-05 LAB — CBC WITH DIFFERENTIAL/PLATELET
BASO%: 0.4 % (ref 0.0–2.0)
Basophils Absolute: 0 10*3/uL (ref 0.0–0.1)
EOS%: 2.4 % (ref 0.0–7.0)
Eosinophils Absolute: 0.2 10*3/uL (ref 0.0–0.5)
HEMATOCRIT: 38.8 % (ref 34.8–46.6)
HEMOGLOBIN: 13.2 g/dL (ref 11.6–15.9)
LYMPH#: 2.3 10*3/uL (ref 0.9–3.3)
LYMPH%: 27.6 % (ref 14.0–49.7)
MCH: 32.8 pg (ref 25.1–34.0)
MCHC: 34 g/dL (ref 31.5–36.0)
MCV: 96.3 fL (ref 79.5–101.0)
MONO#: 0.7 10*3/uL (ref 0.1–0.9)
MONO%: 8.4 % (ref 0.0–14.0)
NEUT%: 61.2 % (ref 38.4–76.8)
NEUTROS ABS: 5.1 10*3/uL (ref 1.5–6.5)
PLATELETS: 206 10*3/uL (ref 145–400)
RBC: 4.03 10*6/uL (ref 3.70–5.45)
RDW: 13 % (ref 11.2–14.5)
WBC: 8.3 10*3/uL (ref 3.9–10.3)

## 2016-12-05 LAB — COMPREHENSIVE METABOLIC PANEL
ALBUMIN: 4 g/dL (ref 3.5–5.0)
ALK PHOS: 79 U/L (ref 40–150)
ALT: 21 U/L (ref 0–55)
ANION GAP: 9 meq/L (ref 3–11)
AST: 27 U/L (ref 5–34)
BILIRUBIN TOTAL: 0.37 mg/dL (ref 0.20–1.20)
BUN: 24.6 mg/dL (ref 7.0–26.0)
CO2: 28 mEq/L (ref 22–29)
Calcium: 9.4 mg/dL (ref 8.4–10.4)
Chloride: 102 mEq/L (ref 98–109)
Creatinine: 1.1 mg/dL (ref 0.6–1.1)
EGFR: 51 mL/min/{1.73_m2} — AB (ref >60)
GLUCOSE: 96 mg/dL (ref 70–140)
POTASSIUM: 4.2 meq/L (ref 3.5–5.1)
SODIUM: 139 meq/L (ref 136–145)
Total Protein: 7 g/dL (ref 6.4–8.3)

## 2016-12-05 NOTE — Telephone Encounter (Signed)
Scheduled appt per 12/3 los - gave patient AVS and calender per los.   

## 2016-12-06 LAB — CANCER ANTIGEN 27.29: CA 27.29: 34.4 U/mL (ref 0.0–38.6)

## 2016-12-15 DIAGNOSIS — H353132 Nonexudative age-related macular degeneration, bilateral, intermediate dry stage: Secondary | ICD-10-CM | POA: Diagnosis not present

## 2016-12-16 ENCOUNTER — Telehealth: Payer: Self-pay

## 2016-12-16 NOTE — Telephone Encounter (Signed)
Pt called for tumor marker results. Given.

## 2017-01-14 DIAGNOSIS — H353132 Nonexudative age-related macular degeneration, bilateral, intermediate dry stage: Secondary | ICD-10-CM | POA: Diagnosis not present

## 2017-02-13 DIAGNOSIS — H353132 Nonexudative age-related macular degeneration, bilateral, intermediate dry stage: Secondary | ICD-10-CM | POA: Diagnosis not present

## 2017-03-03 ENCOUNTER — Telehealth: Payer: Self-pay | Admitting: *Deleted

## 2017-03-03 ENCOUNTER — Other Ambulatory Visit: Payer: Self-pay | Admitting: *Deleted

## 2017-03-03 DIAGNOSIS — C50912 Malignant neoplasm of unspecified site of left female breast: Secondary | ICD-10-CM

## 2017-03-03 NOTE — Telephone Encounter (Signed)
No entry 

## 2017-03-06 ENCOUNTER — Inpatient Hospital Stay: Payer: Medicare Other | Attending: Oncology

## 2017-03-06 DIAGNOSIS — C50912 Malignant neoplasm of unspecified site of left female breast: Secondary | ICD-10-CM

## 2017-03-06 DIAGNOSIS — Z853 Personal history of malignant neoplasm of breast: Secondary | ICD-10-CM | POA: Diagnosis not present

## 2017-03-06 LAB — CMP (CANCER CENTER ONLY)
ALBUMIN: 3.9 g/dL (ref 3.5–5.0)
ALT: 23 U/L (ref 0–55)
AST: 25 U/L (ref 5–34)
Alkaline Phosphatase: 77 U/L (ref 40–150)
Anion gap: 9 (ref 3–11)
BUN: 22 mg/dL (ref 7–26)
CHLORIDE: 100 mmol/L (ref 98–109)
CO2: 30 mmol/L — AB (ref 22–29)
CREATININE: 1.01 mg/dL (ref 0.60–1.10)
Calcium: 10 mg/dL (ref 8.4–10.4)
GFR, EST NON AFRICAN AMERICAN: 53 mL/min — AB (ref >60)
GFR, Est AFR Am: >60 mL/min (ref >60)
GLUCOSE: 93 mg/dL (ref 70–140)
Potassium: 4.3 mmol/L (ref 3.5–5.1)
SODIUM: 139 mmol/L (ref 136–145)
Total Bilirubin: 0.7 mg/dL (ref 0.2–1.2)
Total Protein: 6.7 g/dL (ref 6.4–8.3)

## 2017-03-06 LAB — CBC WITH DIFFERENTIAL (CANCER CENTER ONLY)
Basophils Absolute: 0 10*3/uL (ref 0.0–0.1)
Basophils Relative: 0 %
EOS ABS: 0.3 10*3/uL (ref 0.0–0.5)
EOS PCT: 4 %
HCT: 40.5 % (ref 34.8–46.6)
Hemoglobin: 13.8 g/dL (ref 11.6–15.9)
LYMPHS ABS: 2.4 10*3/uL (ref 0.9–3.3)
LYMPHS PCT: 34 %
MCH: 32.5 pg (ref 25.1–34.0)
MCHC: 34.1 g/dL (ref 31.5–36.0)
MCV: 95.5 fL (ref 79.5–101.0)
MONO ABS: 0.6 10*3/uL (ref 0.1–0.9)
Monocytes Relative: 9 %
Neutro Abs: 3.8 10*3/uL (ref 1.5–6.5)
Neutrophils Relative %: 53 %
PLATELETS: 199 10*3/uL (ref 145–400)
RBC: 4.24 MIL/uL (ref 3.70–5.45)
RDW: 13.2 % (ref 11.2–14.5)
WBC Count: 7.2 10*3/uL (ref 3.9–10.3)

## 2017-03-07 ENCOUNTER — Encounter: Payer: Self-pay | Admitting: Oncology

## 2017-03-07 ENCOUNTER — Other Ambulatory Visit: Payer: Self-pay | Admitting: Oncology

## 2017-03-07 LAB — CANCER ANTIGEN 27.29: CAN 27.29: 40.5 U/mL — AB (ref 0.0–38.6)

## 2017-03-07 NOTE — Progress Notes (Signed)
The patient's repeat CA-27-29 is actually a little bit lower than last year.  There is no trend.  This requires only follow-up.  I wrote the patient and her doctor a letter with that information

## 2017-03-15 DIAGNOSIS — H353132 Nonexudative age-related macular degeneration, bilateral, intermediate dry stage: Secondary | ICD-10-CM | POA: Diagnosis not present

## 2017-04-14 DIAGNOSIS — H353132 Nonexudative age-related macular degeneration, bilateral, intermediate dry stage: Secondary | ICD-10-CM | POA: Diagnosis not present

## 2017-05-14 DIAGNOSIS — H353132 Nonexudative age-related macular degeneration, bilateral, intermediate dry stage: Secondary | ICD-10-CM | POA: Diagnosis not present

## 2017-05-15 DIAGNOSIS — Z6836 Body mass index (BMI) 36.0-36.9, adult: Secondary | ICD-10-CM | POA: Diagnosis not present

## 2017-05-15 DIAGNOSIS — F321 Major depressive disorder, single episode, moderate: Secondary | ICD-10-CM | POA: Diagnosis not present

## 2017-05-15 DIAGNOSIS — M109 Gout, unspecified: Secondary | ICD-10-CM | POA: Diagnosis not present

## 2017-05-15 DIAGNOSIS — E7800 Pure hypercholesterolemia, unspecified: Secondary | ICD-10-CM | POA: Diagnosis not present

## 2017-05-15 DIAGNOSIS — M1712 Unilateral primary osteoarthritis, left knee: Secondary | ICD-10-CM | POA: Diagnosis not present

## 2017-05-15 DIAGNOSIS — Z8542 Personal history of malignant neoplasm of other parts of uterus: Secondary | ICD-10-CM | POA: Diagnosis not present

## 2017-05-15 DIAGNOSIS — I10 Essential (primary) hypertension: Secondary | ICD-10-CM | POA: Diagnosis not present

## 2017-05-15 DIAGNOSIS — J302 Other seasonal allergic rhinitis: Secondary | ICD-10-CM | POA: Diagnosis not present

## 2017-06-06 ENCOUNTER — Other Ambulatory Visit: Payer: Self-pay

## 2017-06-06 DIAGNOSIS — C50912 Malignant neoplasm of unspecified site of left female breast: Secondary | ICD-10-CM

## 2017-06-07 ENCOUNTER — Inpatient Hospital Stay: Payer: Medicare Other | Attending: Oncology

## 2017-06-07 DIAGNOSIS — Z853 Personal history of malignant neoplasm of breast: Secondary | ICD-10-CM | POA: Insufficient documentation

## 2017-06-07 DIAGNOSIS — C50912 Malignant neoplasm of unspecified site of left female breast: Secondary | ICD-10-CM

## 2017-06-07 LAB — CMP (CANCER CENTER ONLY)
ALBUMIN: 3.8 g/dL (ref 3.5–5.0)
ALT: 20 U/L (ref 0–55)
AST: 26 U/L (ref 5–34)
Alkaline Phosphatase: 83 U/L (ref 40–150)
Anion gap: 10 (ref 3–11)
BUN: 21 mg/dL (ref 7–26)
CHLORIDE: 100 mmol/L (ref 98–109)
CO2: 26 mmol/L (ref 22–29)
Calcium: 9.3 mg/dL (ref 8.4–10.4)
Creatinine: 0.88 mg/dL (ref 0.60–1.10)
GFR, Est AFR Am: >60 mL/min (ref >60)
GFR, Estimated: >60 mL/min (ref >60)
GLUCOSE: 93 mg/dL (ref 70–140)
POTASSIUM: 4.1 mmol/L (ref 3.5–5.1)
SODIUM: 136 mmol/L (ref 136–145)
Total Bilirubin: 0.6 mg/dL (ref 0.2–1.2)
Total Protein: 6.6 g/dL (ref 6.4–8.3)

## 2017-06-07 LAB — CBC WITH DIFFERENTIAL (CANCER CENTER ONLY)
Basophils Absolute: 0 10*3/uL (ref 0.0–0.1)
Basophils Relative: 0 %
EOS PCT: 3 %
Eosinophils Absolute: 0.3 10*3/uL (ref 0.0–0.5)
HEMATOCRIT: 39.9 % (ref 34.8–46.6)
Hemoglobin: 13.5 g/dL (ref 11.6–15.9)
LYMPHS ABS: 2.4 10*3/uL (ref 0.9–3.3)
LYMPHS PCT: 31 %
MCH: 32.1 pg (ref 25.1–34.0)
MCHC: 33.8 g/dL (ref 31.5–36.0)
MCV: 94.8 fL (ref 79.5–101.0)
MONO ABS: 0.7 10*3/uL (ref 0.1–0.9)
Monocytes Relative: 8 %
Neutro Abs: 4.6 10*3/uL (ref 1.5–6.5)
Neutrophils Relative %: 58 %
PLATELETS: 199 10*3/uL (ref 145–400)
RBC: 4.21 MIL/uL (ref 3.70–5.45)
RDW: 13.3 % (ref 11.2–14.5)
WBC Count: 8 10*3/uL (ref 3.9–10.3)

## 2017-06-13 DIAGNOSIS — H353132 Nonexudative age-related macular degeneration, bilateral, intermediate dry stage: Secondary | ICD-10-CM | POA: Diagnosis not present

## 2017-06-19 DIAGNOSIS — M25562 Pain in left knee: Secondary | ICD-10-CM | POA: Diagnosis not present

## 2017-06-29 ENCOUNTER — Other Ambulatory Visit: Payer: Self-pay | Admitting: *Deleted

## 2017-06-29 DIAGNOSIS — C50912 Malignant neoplasm of unspecified site of left female breast: Secondary | ICD-10-CM

## 2017-07-03 ENCOUNTER — Inpatient Hospital Stay: Payer: Medicare Other | Attending: Oncology

## 2017-07-03 DIAGNOSIS — Z853 Personal history of malignant neoplasm of breast: Secondary | ICD-10-CM | POA: Insufficient documentation

## 2017-07-03 DIAGNOSIS — C50912 Malignant neoplasm of unspecified site of left female breast: Secondary | ICD-10-CM

## 2017-07-04 ENCOUNTER — Telehealth: Payer: Self-pay

## 2017-07-04 ENCOUNTER — Other Ambulatory Visit: Payer: Self-pay | Admitting: Oncology

## 2017-07-04 LAB — CANCER ANTIGEN 27.29: CAN 27.29: 27.3 U/mL (ref 0.0–38.6)

## 2017-07-04 NOTE — Telephone Encounter (Signed)
Informed patient of lab results, patient voiced understanding.  Requested labs to be mailed.  Patient with no further needs at this time.

## 2017-07-13 DIAGNOSIS — H353132 Nonexudative age-related macular degeneration, bilateral, intermediate dry stage: Secondary | ICD-10-CM | POA: Diagnosis not present

## 2017-08-12 DIAGNOSIS — H353132 Nonexudative age-related macular degeneration, bilateral, intermediate dry stage: Secondary | ICD-10-CM | POA: Diagnosis not present

## 2017-08-22 ENCOUNTER — Other Ambulatory Visit: Payer: Self-pay | Admitting: Internal Medicine

## 2017-08-22 DIAGNOSIS — Z1231 Encounter for screening mammogram for malignant neoplasm of breast: Secondary | ICD-10-CM

## 2017-09-11 DIAGNOSIS — H353132 Nonexudative age-related macular degeneration, bilateral, intermediate dry stage: Secondary | ICD-10-CM | POA: Diagnosis not present

## 2017-10-11 DIAGNOSIS — H353132 Nonexudative age-related macular degeneration, bilateral, intermediate dry stage: Secondary | ICD-10-CM | POA: Diagnosis not present

## 2017-10-23 ENCOUNTER — Ambulatory Visit
Admission: RE | Admit: 2017-10-23 | Discharge: 2017-10-23 | Disposition: A | Discharge status: Home / Self Care | Payer: Medicare Other | Source: Ambulatory Visit | Attending: Internal Medicine | Admitting: Internal Medicine

## 2017-10-23 DIAGNOSIS — Z1231 Encounter for screening mammogram for malignant neoplasm of breast: Secondary | ICD-10-CM

## 2017-10-24 ENCOUNTER — Other Ambulatory Visit: Payer: Self-pay | Admitting: Internal Medicine

## 2017-10-24 DIAGNOSIS — R928 Other abnormal and inconclusive findings on diagnostic imaging of breast: Secondary | ICD-10-CM

## 2017-10-26 ENCOUNTER — Other Ambulatory Visit: Payer: Self-pay | Admitting: Internal Medicine

## 2017-10-26 ENCOUNTER — Ambulatory Visit
Admission: RE | Admit: 2017-10-26 | Discharge: 2017-10-26 | Disposition: A | Discharge status: Home / Self Care | Payer: Medicare Other | Source: Ambulatory Visit | Attending: Internal Medicine | Admitting: Internal Medicine

## 2017-10-26 DIAGNOSIS — R928 Other abnormal and inconclusive findings on diagnostic imaging of breast: Secondary | ICD-10-CM

## 2017-10-26 DIAGNOSIS — N6323 Unspecified lump in the left breast, lower outer quadrant: Secondary | ICD-10-CM | POA: Diagnosis not present

## 2017-10-26 DIAGNOSIS — N6320 Unspecified lump in the left breast, unspecified quadrant: Secondary | ICD-10-CM

## 2017-11-01 ENCOUNTER — Other Ambulatory Visit: Payer: Self-pay | Admitting: Internal Medicine

## 2017-11-01 ENCOUNTER — Ambulatory Visit
Admission: RE | Admit: 2017-11-01 | Discharge: 2017-11-01 | Disposition: A | Discharge status: Home / Self Care | Payer: Medicare Other | Source: Ambulatory Visit | Attending: Internal Medicine | Admitting: Internal Medicine

## 2017-11-01 DIAGNOSIS — N641 Fat necrosis of breast: Secondary | ICD-10-CM | POA: Diagnosis not present

## 2017-11-01 DIAGNOSIS — N6320 Unspecified lump in the left breast, unspecified quadrant: Secondary | ICD-10-CM

## 2017-11-01 DIAGNOSIS — N6323 Unspecified lump in the left breast, lower outer quadrant: Secondary | ICD-10-CM | POA: Diagnosis not present

## 2017-11-10 DIAGNOSIS — H353132 Nonexudative age-related macular degeneration, bilateral, intermediate dry stage: Secondary | ICD-10-CM | POA: Diagnosis not present

## 2017-11-15 DIAGNOSIS — M109 Gout, unspecified: Secondary | ICD-10-CM | POA: Diagnosis not present

## 2017-11-15 DIAGNOSIS — Z8542 Personal history of malignant neoplasm of other parts of uterus: Secondary | ICD-10-CM | POA: Diagnosis not present

## 2017-11-15 DIAGNOSIS — E7800 Pure hypercholesterolemia, unspecified: Secondary | ICD-10-CM | POA: Diagnosis not present

## 2017-11-15 DIAGNOSIS — R82998 Other abnormal findings in urine: Secondary | ICD-10-CM | POA: Diagnosis not present

## 2017-11-15 DIAGNOSIS — I10 Essential (primary) hypertension: Secondary | ICD-10-CM | POA: Diagnosis not present

## 2017-11-22 DIAGNOSIS — M170 Bilateral primary osteoarthritis of knee: Secondary | ICD-10-CM | POA: Diagnosis not present

## 2017-11-22 DIAGNOSIS — D126 Benign neoplasm of colon, unspecified: Secondary | ICD-10-CM | POA: Diagnosis not present

## 2017-11-22 DIAGNOSIS — M109 Gout, unspecified: Secondary | ICD-10-CM | POA: Diagnosis not present

## 2017-11-22 DIAGNOSIS — Z23 Encounter for immunization: Secondary | ICD-10-CM | POA: Diagnosis not present

## 2017-11-22 DIAGNOSIS — Z8542 Personal history of malignant neoplasm of other parts of uterus: Secondary | ICD-10-CM | POA: Diagnosis not present

## 2017-11-22 DIAGNOSIS — Z1389 Encounter for screening for other disorder: Secondary | ICD-10-CM | POA: Diagnosis not present

## 2017-11-22 DIAGNOSIS — F321 Major depressive disorder, single episode, moderate: Secondary | ICD-10-CM | POA: Diagnosis not present

## 2017-11-22 DIAGNOSIS — I10 Essential (primary) hypertension: Secondary | ICD-10-CM | POA: Diagnosis not present

## 2017-11-22 DIAGNOSIS — J302 Other seasonal allergic rhinitis: Secondary | ICD-10-CM | POA: Diagnosis not present

## 2017-11-22 DIAGNOSIS — Z6836 Body mass index (BMI) 36.0-36.9, adult: Secondary | ICD-10-CM | POA: Diagnosis not present

## 2017-11-22 DIAGNOSIS — Z0000 Encounter for general adult medical examination without abnormal findings: Secondary | ICD-10-CM | POA: Diagnosis not present

## 2017-11-22 DIAGNOSIS — E7800 Pure hypercholesterolemia, unspecified: Secondary | ICD-10-CM | POA: Diagnosis not present

## 2017-11-23 DIAGNOSIS — Z1212 Encounter for screening for malignant neoplasm of rectum: Secondary | ICD-10-CM | POA: Diagnosis not present

## 2017-12-10 DIAGNOSIS — H353132 Nonexudative age-related macular degeneration, bilateral, intermediate dry stage: Secondary | ICD-10-CM | POA: Diagnosis not present

## 2017-12-11 DIAGNOSIS — H353111 Nonexudative age-related macular degeneration, right eye, early dry stage: Secondary | ICD-10-CM | POA: Diagnosis not present

## 2017-12-11 DIAGNOSIS — Z961 Presence of intraocular lens: Secondary | ICD-10-CM | POA: Diagnosis not present

## 2017-12-11 DIAGNOSIS — H353122 Nonexudative age-related macular degeneration, left eye, intermediate dry stage: Secondary | ICD-10-CM | POA: Diagnosis not present

## 2018-01-09 DIAGNOSIS — H353132 Nonexudative age-related macular degeneration, bilateral, intermediate dry stage: Secondary | ICD-10-CM | POA: Diagnosis not present

## 2018-02-08 DIAGNOSIS — H353132 Nonexudative age-related macular degeneration, bilateral, intermediate dry stage: Secondary | ICD-10-CM | POA: Diagnosis not present

## 2018-03-06 ENCOUNTER — Other Ambulatory Visit: Payer: Self-pay | Admitting: Internal Medicine

## 2018-03-06 DIAGNOSIS — N6320 Unspecified lump in the left breast, unspecified quadrant: Secondary | ICD-10-CM

## 2018-03-10 DIAGNOSIS — H353132 Nonexudative age-related macular degeneration, bilateral, intermediate dry stage: Secondary | ICD-10-CM | POA: Diagnosis not present

## 2018-04-09 DIAGNOSIS — H353132 Nonexudative age-related macular degeneration, bilateral, intermediate dry stage: Secondary | ICD-10-CM | POA: Diagnosis not present

## 2018-05-07 ENCOUNTER — Other Ambulatory Visit: Payer: Self-pay

## 2018-05-07 ENCOUNTER — Ambulatory Visit
Admission: RE | Admit: 2018-05-07 | Discharge: 2018-05-07 | Disposition: A | Discharge status: Home / Self Care | Payer: Medicare Other | Source: Ambulatory Visit | Attending: Internal Medicine | Admitting: Internal Medicine

## 2018-05-07 ENCOUNTER — Ambulatory Visit: Payer: Medicare Other

## 2018-05-07 DIAGNOSIS — N6320 Unspecified lump in the left breast, unspecified quadrant: Secondary | ICD-10-CM

## 2018-05-07 DIAGNOSIS — R928 Other abnormal and inconclusive findings on diagnostic imaging of breast: Secondary | ICD-10-CM | POA: Diagnosis not present

## 2018-05-22 DIAGNOSIS — E7800 Pure hypercholesterolemia, unspecified: Secondary | ICD-10-CM | POA: Diagnosis not present

## 2018-05-22 DIAGNOSIS — M179 Osteoarthritis of knee, unspecified: Secondary | ICD-10-CM | POA: Diagnosis not present

## 2018-05-22 DIAGNOSIS — I10 Essential (primary) hypertension: Secondary | ICD-10-CM | POA: Diagnosis not present

## 2018-05-22 DIAGNOSIS — M109 Gout, unspecified: Secondary | ICD-10-CM | POA: Diagnosis not present

## 2018-05-22 DIAGNOSIS — Z853 Personal history of malignant neoplasm of breast: Secondary | ICD-10-CM | POA: Diagnosis not present

## 2018-05-22 DIAGNOSIS — F321 Major depressive disorder, single episode, moderate: Secondary | ICD-10-CM | POA: Diagnosis not present

## 2018-05-22 DIAGNOSIS — Z8542 Personal history of malignant neoplasm of other parts of uterus: Secondary | ICD-10-CM | POA: Diagnosis not present

## 2018-05-22 DIAGNOSIS — J302 Other seasonal allergic rhinitis: Secondary | ICD-10-CM | POA: Diagnosis not present

## 2018-06-07 DIAGNOSIS — H353132 Nonexudative age-related macular degeneration, bilateral, intermediate dry stage: Secondary | ICD-10-CM | POA: Diagnosis not present

## 2018-07-07 DIAGNOSIS — H353132 Nonexudative age-related macular degeneration, bilateral, intermediate dry stage: Secondary | ICD-10-CM | POA: Diagnosis not present

## 2018-08-06 DIAGNOSIS — H353132 Nonexudative age-related macular degeneration, bilateral, intermediate dry stage: Secondary | ICD-10-CM | POA: Diagnosis not present

## 2018-09-05 DIAGNOSIS — H353132 Nonexudative age-related macular degeneration, bilateral, intermediate dry stage: Secondary | ICD-10-CM | POA: Diagnosis not present

## 2018-09-17 ENCOUNTER — Other Ambulatory Visit: Payer: Self-pay | Admitting: Internal Medicine

## 2018-09-17 DIAGNOSIS — Z1231 Encounter for screening mammogram for malignant neoplasm of breast: Secondary | ICD-10-CM

## 2018-10-05 DIAGNOSIS — H353132 Nonexudative age-related macular degeneration, bilateral, intermediate dry stage: Secondary | ICD-10-CM | POA: Diagnosis not present

## 2018-10-11 DIAGNOSIS — Z23 Encounter for immunization: Secondary | ICD-10-CM | POA: Diagnosis not present

## 2018-11-02 ENCOUNTER — Ambulatory Visit
Admission: RE | Admit: 2018-11-02 | Discharge: 2018-11-02 | Disposition: A | Discharge status: Home / Self Care | Payer: Medicare Other | Source: Ambulatory Visit | Attending: Internal Medicine | Admitting: Internal Medicine

## 2018-11-02 ENCOUNTER — Other Ambulatory Visit: Payer: Self-pay

## 2018-11-02 DIAGNOSIS — Z1231 Encounter for screening mammogram for malignant neoplasm of breast: Secondary | ICD-10-CM

## 2018-11-04 DIAGNOSIS — H353132 Nonexudative age-related macular degeneration, bilateral, intermediate dry stage: Secondary | ICD-10-CM | POA: Diagnosis not present

## 2018-11-19 DIAGNOSIS — E7800 Pure hypercholesterolemia, unspecified: Secondary | ICD-10-CM | POA: Diagnosis not present

## 2018-11-19 DIAGNOSIS — M109 Gout, unspecified: Secondary | ICD-10-CM | POA: Diagnosis not present

## 2018-11-21 DIAGNOSIS — R82998 Other abnormal findings in urine: Secondary | ICD-10-CM | POA: Diagnosis not present

## 2018-11-21 DIAGNOSIS — I10 Essential (primary) hypertension: Secondary | ICD-10-CM | POA: Diagnosis not present

## 2018-11-26 DIAGNOSIS — Z8542 Personal history of malignant neoplasm of other parts of uterus: Secondary | ICD-10-CM | POA: Diagnosis not present

## 2018-11-26 DIAGNOSIS — Z853 Personal history of malignant neoplasm of breast: Secondary | ICD-10-CM | POA: Diagnosis not present

## 2018-12-04 DIAGNOSIS — H353132 Nonexudative age-related macular degeneration, bilateral, intermediate dry stage: Secondary | ICD-10-CM | POA: Diagnosis not present

## 2018-12-17 DIAGNOSIS — Z961 Presence of intraocular lens: Secondary | ICD-10-CM | POA: Diagnosis not present

## 2018-12-17 DIAGNOSIS — H353111 Nonexudative age-related macular degeneration, right eye, early dry stage: Secondary | ICD-10-CM | POA: Diagnosis not present

## 2018-12-17 DIAGNOSIS — H353122 Nonexudative age-related macular degeneration, left eye, intermediate dry stage: Secondary | ICD-10-CM | POA: Diagnosis not present

## 2019-02-02 DIAGNOSIS — H353132 Nonexudative age-related macular degeneration, bilateral, intermediate dry stage: Secondary | ICD-10-CM | POA: Diagnosis not present

## 2019-03-04 DIAGNOSIS — H353132 Nonexudative age-related macular degeneration, bilateral, intermediate dry stage: Secondary | ICD-10-CM | POA: Diagnosis not present

## 2019-04-03 DIAGNOSIS — H353132 Nonexudative age-related macular degeneration, bilateral, intermediate dry stage: Secondary | ICD-10-CM | POA: Diagnosis not present

## 2019-05-03 DIAGNOSIS — H353132 Nonexudative age-related macular degeneration, bilateral, intermediate dry stage: Secondary | ICD-10-CM | POA: Diagnosis not present

## 2019-05-13 ENCOUNTER — Other Ambulatory Visit: Payer: Self-pay | Admitting: Internal Medicine

## 2019-05-13 DIAGNOSIS — Z1231 Encounter for screening mammogram for malignant neoplasm of breast: Secondary | ICD-10-CM

## 2019-05-16 IMAGING — MG DIGITAL SCREENING BILATERAL MAMMOGRAM WITH TOMO AND CAD
8 series · 8 of 24 positions shown · non-contrast
Comparison: Previous exam(s).

CLINICAL DATA: Screening.

EXAM:
DIGITAL SCREENING BILATERAL MAMMOGRAM WITH TOMO AND CAD

[L MLO synth-2D]
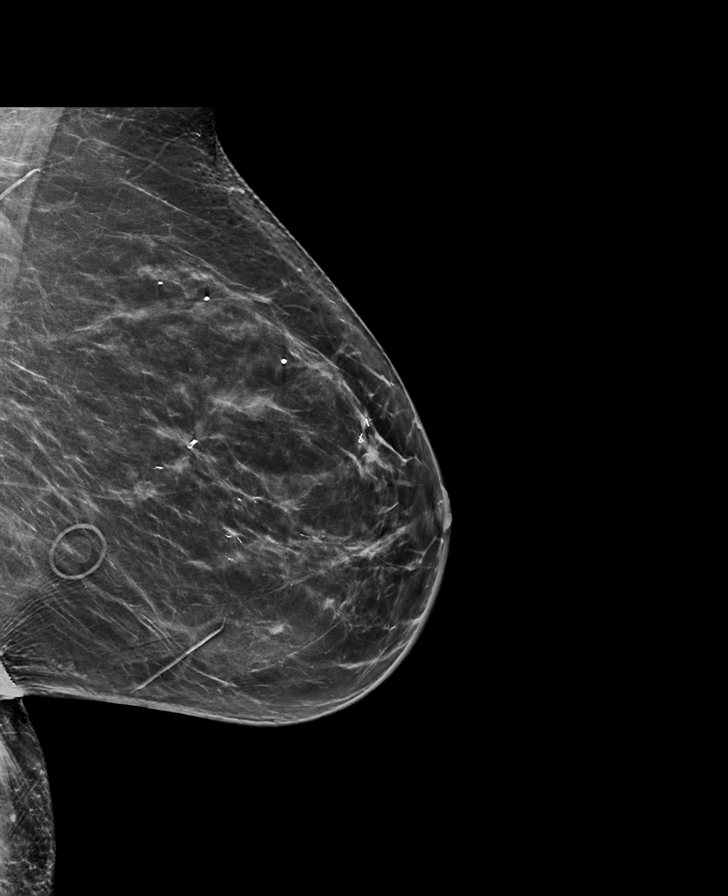

[R CC synth-2D]
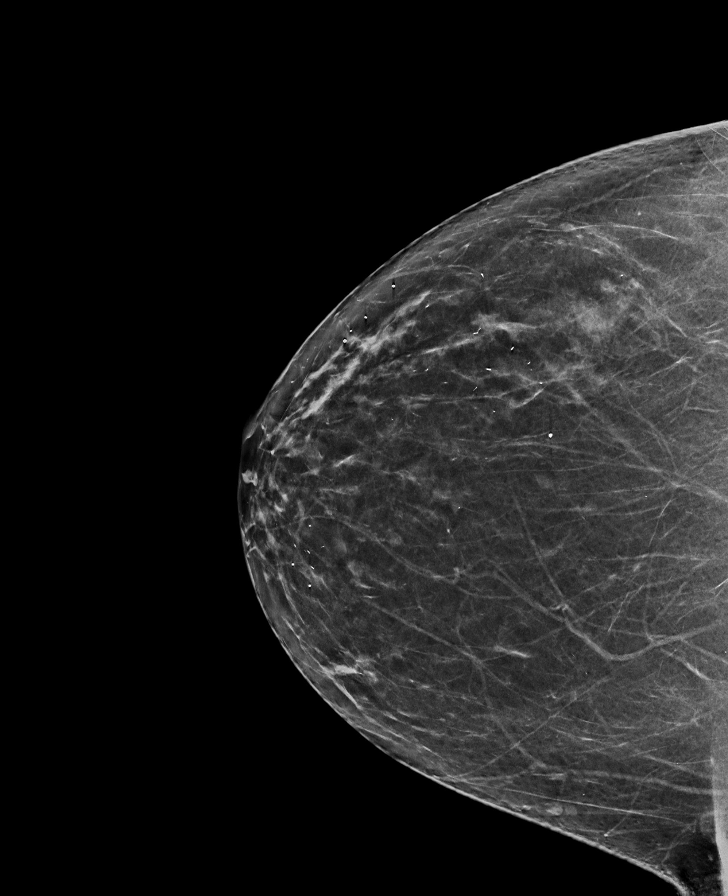

[L CC synth-2D]
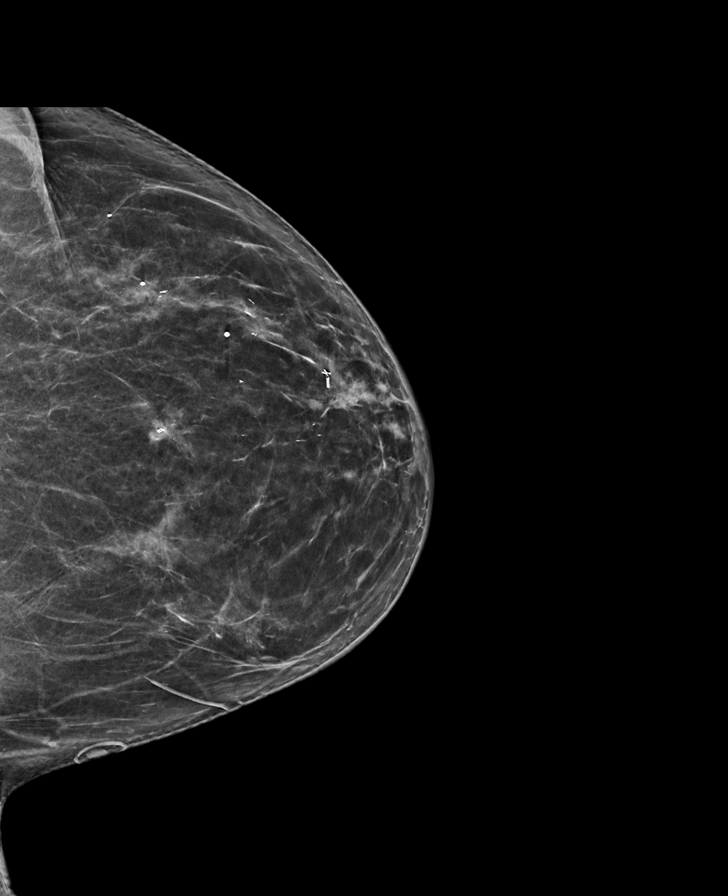

[R MLO synth-2D]
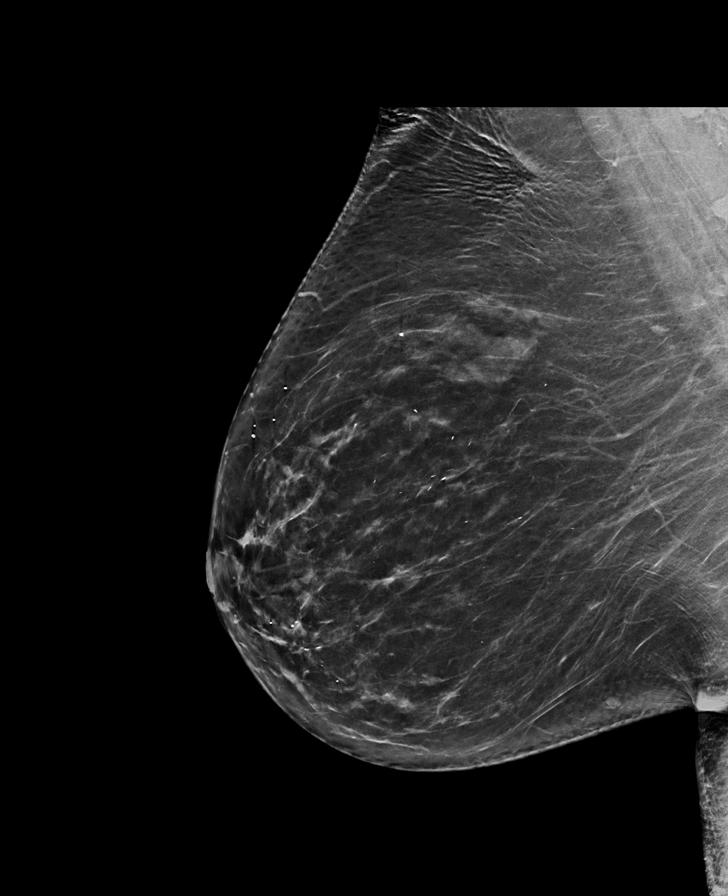

[R CC tomo · tomo slice 37/72.0]
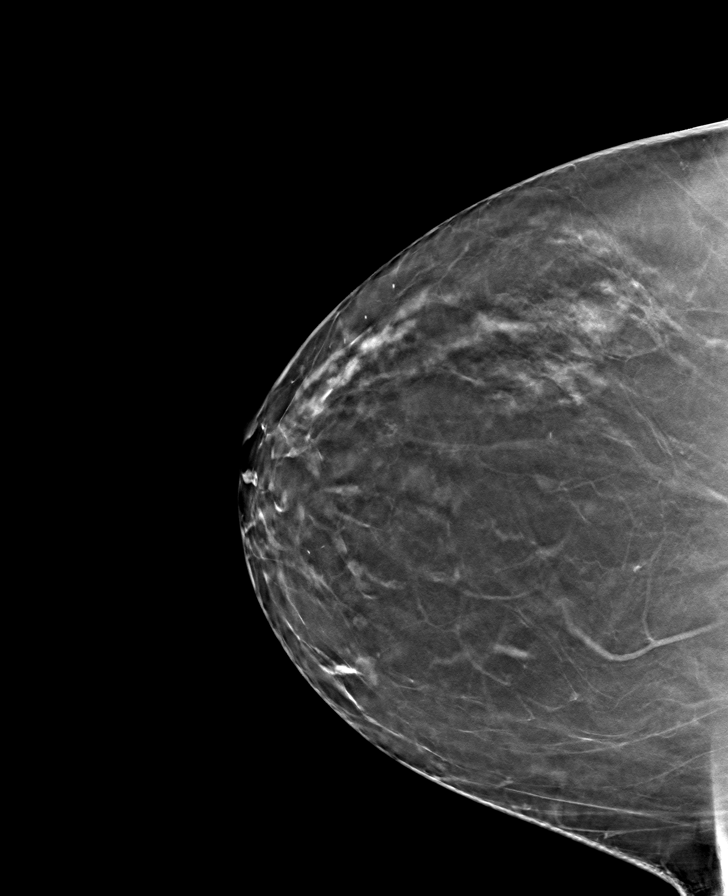

[L CC tomo · tomo slice 36/71.0]
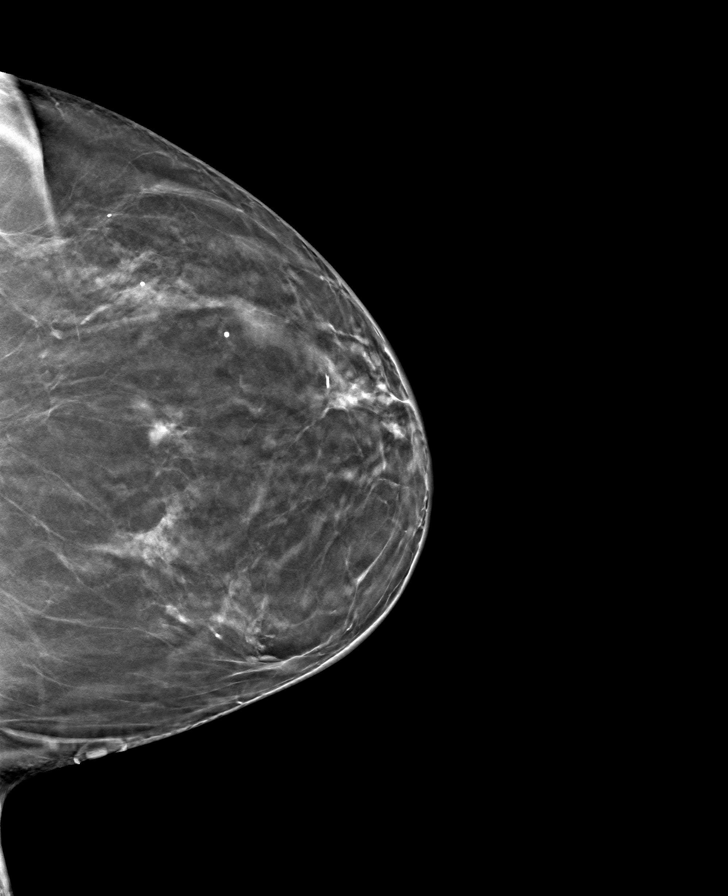

[R MLO tomo · tomo slice 41/82.0]
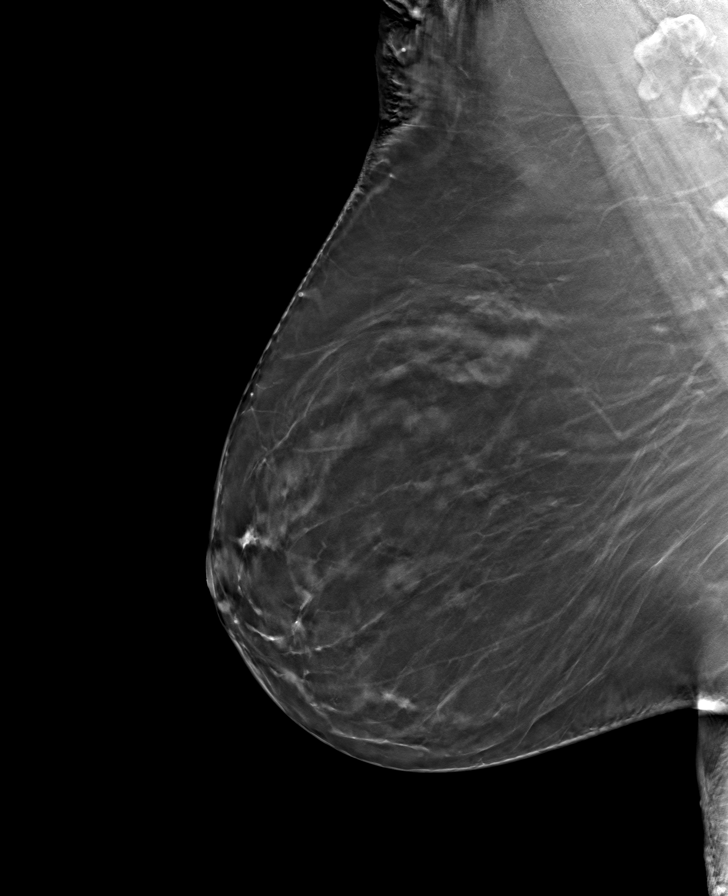

[L MLO tomo · tomo slice 41/80.0]
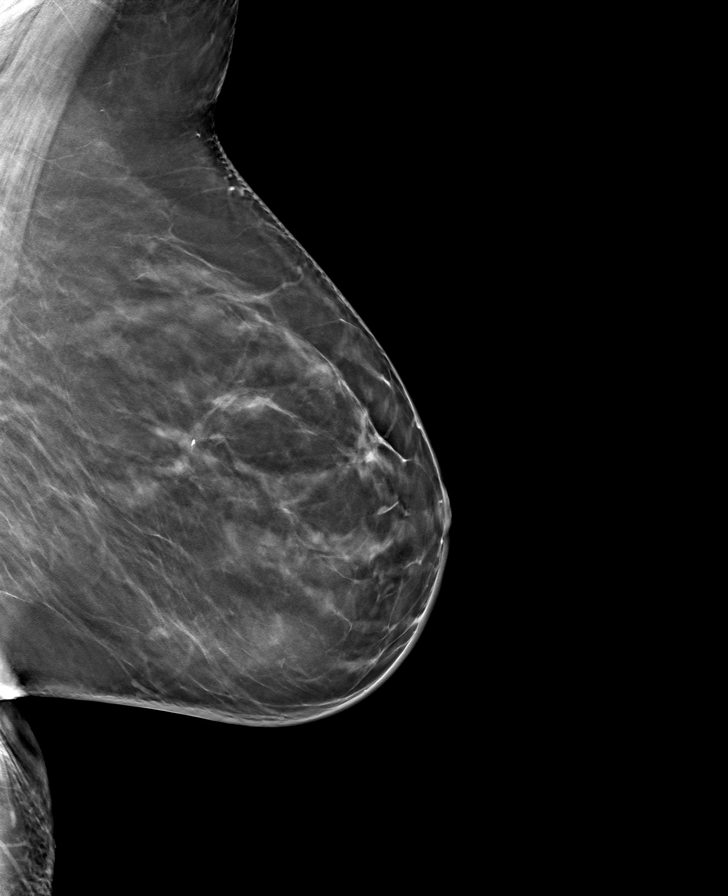

[8 of 24 positions shown; findings below may reference images not displayed]

ACR Breast Density Category b: There are scattered areas of
fibroglandular density.
FINDINGS: In the left breast, a possible mass warrants further evaluation. In
the right breast, no findings suspicious for malignancy.

Images were processed with CAD.
IMPRESSION: Further evaluation is suggested for possible mass in the left
breast.

RECOMMENDATION:
Diagnostic mammogram and possibly ultrasound of the left breast.
(Code:JC-2-SSL)

The patient will be contacted regarding the findings, and additional
imaging will be scheduled.

BI-RADS CATEGORY  0: Incomplete. Need additional imaging evaluation
and/or prior mammograms for comparison.

## 2019-05-28 DIAGNOSIS — Z1331 Encounter for screening for depression: Secondary | ICD-10-CM | POA: Diagnosis not present

## 2019-05-28 DIAGNOSIS — Z853 Personal history of malignant neoplasm of breast: Secondary | ICD-10-CM | POA: Diagnosis not present

## 2019-05-28 DIAGNOSIS — J302 Other seasonal allergic rhinitis: Secondary | ICD-10-CM | POA: Diagnosis not present

## 2019-05-28 DIAGNOSIS — E7800 Pure hypercholesterolemia, unspecified: Secondary | ICD-10-CM | POA: Diagnosis not present

## 2019-05-28 DIAGNOSIS — F321 Major depressive disorder, single episode, moderate: Secondary | ICD-10-CM | POA: Diagnosis not present

## 2019-05-28 DIAGNOSIS — I10 Essential (primary) hypertension: Secondary | ICD-10-CM | POA: Diagnosis not present

## 2019-05-28 DIAGNOSIS — M109 Gout, unspecified: Secondary | ICD-10-CM | POA: Diagnosis not present

## 2019-05-28 DIAGNOSIS — Z8542 Personal history of malignant neoplasm of other parts of uterus: Secondary | ICD-10-CM | POA: Diagnosis not present

## 2019-06-02 DIAGNOSIS — H353132 Nonexudative age-related macular degeneration, bilateral, intermediate dry stage: Secondary | ICD-10-CM | POA: Diagnosis not present

## 2019-07-19 ENCOUNTER — Encounter: Payer: Self-pay | Admitting: Gastroenterology

## 2019-08-01 DIAGNOSIS — H353132 Nonexudative age-related macular degeneration, bilateral, intermediate dry stage: Secondary | ICD-10-CM | POA: Diagnosis not present

## 2019-08-31 DIAGNOSIS — H353132 Nonexudative age-related macular degeneration, bilateral, intermediate dry stage: Secondary | ICD-10-CM | POA: Diagnosis not present

## 2019-09-11 ENCOUNTER — Other Ambulatory Visit: Payer: Self-pay

## 2019-09-11 ENCOUNTER — Ambulatory Visit (AMBULATORY_SURGERY_CENTER): Payer: Self-pay | Admitting: *Deleted

## 2019-09-11 VITALS — Ht 66.0 in | Wt 226.0 lb

## 2019-09-11 DIAGNOSIS — Z86010 Personal history of colonic polyps: Secondary | ICD-10-CM

## 2019-09-11 NOTE — Progress Notes (Signed)
cov vax x 2  Shingles 08-2019 #1   No egg or soy allergy known to patient  No issues with past sedation with any surgeries or procedures no intubation problems in the past  No FH of Malignant Hyperthermia No diet pills per patient No home 02 use per patient  No blood thinners per patient  Pt denies issues with constipation - states does come and go and handles with diet No A fib or A flutter  EMMI video to pt or via [facility] 19 guidelines implemented in PV today with Pt and RN    Due to the COVID-19 pandemic we are asking patients to follow these guidelines. Please only bring one care partner. Please be aware that your care partner may wait in the car in the parking lot or if they feel like they will be too hot to wait in the car, they may wait in the lobby on the 4th floor. All care partners are required to wear a mask the entire time (we do not have any that we can provide them), they need to practice social distancing, and we will do a Covid check for all patient's and care partners when you arrive. Also we will check their temperature and your temperature. If the care partner waits in their car they need to stay in the parking lot the entire time and we will call them on their cell phone when the patient is ready for discharge so they can bring the car to the front of the building. Also all patient's will need to wear a mask into building.

## 2019-09-25 ENCOUNTER — Other Ambulatory Visit: Payer: Self-pay

## 2019-09-25 ENCOUNTER — Ambulatory Visit (AMBULATORY_SURGERY_CENTER): Payer: Medicare Other | Admitting: Gastroenterology

## 2019-09-25 ENCOUNTER — Encounter: Payer: Self-pay | Admitting: Gastroenterology

## 2019-09-25 VITALS — BP 121/68 | HR 64 | Temp 96.8°F | Resp 16 | Ht 66.0 in | Wt 226.0 lb

## 2019-09-25 DIAGNOSIS — Z86010 Personal history of colonic polyps: Secondary | ICD-10-CM

## 2019-09-25 DIAGNOSIS — D125 Benign neoplasm of sigmoid colon: Secondary | ICD-10-CM | POA: Diagnosis not present

## 2019-09-25 DIAGNOSIS — D123 Benign neoplasm of transverse colon: Secondary | ICD-10-CM | POA: Diagnosis not present

## 2019-09-25 MED ORDER — SODIUM CHLORIDE 0.9 % IV SOLN: 500.0000 mL | Freq: Once | INTRAVENOUS | Status: DC

## 2019-09-25 NOTE — Patient Instructions (Signed)
Handouts provided on polyps and diverticulosis.  ? ?YOU HAD AN ENDOSCOPIC PROCEDURE TODAY AT THE [facility]:   Refer to the procedure report that was given to you for any specific questions about what was found during the examination.  If the procedure report does not answer your questions, please call your gastroenterologist to clarify.  If you requested that your care partner not be given the details of your procedure findings, then the procedure report has been included in a sealed envelope for you to review at your convenience later. ? ?YOU SHOULD EXPECT: Some feelings of bloating in the abdomen. Passage of more gas than usual.  Walking can help get rid of the air that was put into your GI tract during the procedure and reduce the bloating. If you had a lower endoscopy (such as a colonoscopy or flexible sigmoidoscopy) you may notice spotting of blood in your stool or on the toilet paper. If you underwent a bowel prep for your procedure, you may not have a normal bowel movement for a few days. ? ?Please Note:  You might notice some irritation and congestion in your nose or some drainage.  This is from the oxygen used during your procedure.  There is no need for concern and it should clear up in a day or so. ? ?SYMPTOMS TO REPORT IMMEDIATELY: ? ?Following lower endoscopy (colonoscopy or flexible sigmoidoscopy): ? Excessive amounts of blood in the stool ? Significant tenderness or worsening of abdominal pains ? Swelling of the abdomen that is new, acute ? Fever of 100?F or higher ? ?For urgent or emergent issues, a gastroenterologist can be reached at any hour by calling [phone]. ?Do not use MyChart messaging for urgent concerns.  ? ? ?DIET:  We do recommend a small meal at first, but then you may proceed to your regular diet.  Drink plenty of fluids but you should avoid alcoholic beverages for 24 hours. ? ?ACTIVITY:  You should plan to take it easy for the rest of today and you should NOT  DRIVE or use heavy machinery until tomorrow (because of the sedation medicines used during the test).   ? ?FOLLOW UP: ?Our staff will call the number listed on your records 48-72 hours following your procedure to check on you and address any questions or concerns that you may have regarding the information given to you following your procedure. If we do not reach you, we will leave a message.  We will attempt to reach you two times.  During this call, we will ask if you have developed any symptoms of COVID 19. If you develop any symptoms (ie: fever, flu-like symptoms, shortness of breath, cough etc.) before then, please call [phone].  If you test positive for Covid 19 in the 2 weeks post procedure, please call and report this information to us.   ? ?If any biopsies were taken you will be contacted by phone or by letter within the next 1-3 weeks.  Please call us at [phone] if you have not heard about the biopsies in 3 weeks.  ? ? ?SIGNATURES/CONFIDENTIALITY: ?You and/or your care partner have signed paperwork which will be entered into your electronic medical record.  These signatures attest to the fact that that the information above on your After Visit Summary has been reviewed and is understood.  Full responsibility of the confidentiality of this discharge information lies with you and/or your care-partner. ? ?

## 2019-09-25 NOTE — Progress Notes (Signed)
Called to room to assist during endoscopic procedure.  Patient ID and intended procedure confirmed with present staff. Received instructions for my participation in the procedure from the performing physician.  

## 2019-09-25 NOTE — Progress Notes (Signed)
Pt's states no medical or surgical changes since previsit or office visit. 

## 2019-09-25 NOTE — Op Note (Signed)
[place] Patient Name: Kiara Rose Procedure Date: 09/25/2019 10:21 AM MRN: [id number] Endoscopist: Milus Banister , MD Age: 79 Referring MD:  Date of Birth: 06/23/1940 Gender: Female Account #: [id number] Procedure:                Colonoscopy Indications:              High risk colon cancer surveillance: Personal                            history of colonic polyps; Colonoscopy 2018 seven                            subCM adenomas removed Medicines:                Monitored Anesthesia Care Procedure:                Pre-Anesthesia Assessment:                           - Prior to the procedure, a History and Physical                            was performed, and patient medications and                            allergies were reviewed. The patient's tolerance of                            previous anesthesia was also reviewed. The risks                            and benefits of the procedure and the sedation                            options and risks were discussed with the patient.                            All questions were answered, and informed consent                            was obtained. Prior Anticoagulants: The patient has                            taken no previous anticoagulant or antiplatelet                            agents. ASA Grade Assessment: II - A patient with                            mild systemic disease. After reviewing the risks                            and benefits, the patient was deemed in  satisfactory condition to undergo the procedure.                           After obtaining informed consent, the colonoscope                            was passed under direct vision. Throughout the                            procedure, the patient's blood pressure, pulse, and                            oxygen saturations were monitored continuously. The                            Colonoscope was introduced through the  anus and                            advanced to the the cecum, identified by                            appendiceal orifice and ileocecal valve. The                            colonoscopy was performed without difficulty. The                            patient tolerated the procedure well. The quality                            of the bowel preparation was good. The ileocecal                            valve, appendiceal orifice, and rectum were                            photographed. Scope In: 10:26:39 AM Scope Out: 10:43:12 AM Scope Withdrawal Time: 0 hours 12 minutes 37 seconds  Total Procedure Duration: 0 hours 16 minutes 33 seconds  Findings:                 Four sessile polyps were found in the sigmoid                            colon, transverse colon and hepatic flexure. The                            polyps were 3 to 7 mm in size. These polyps were                            removed with a cold snare. Resection and retrieval                            were complete.  Multiple small and large-mouthed diverticula were                            found in the left colon.                           The exam was otherwise without abnormality on                            direct and retroflexion views. Complications:            No immediate complications. Estimated blood loss:                            None. Estimated Blood Loss:     Estimated blood loss: none. Impression:               - Four 3 to 7 mm polyps in the sigmoid colon, in                            the transverse colon and at the hepatic flexure,                            removed with a cold snare. Resected and retrieved.                           - Diverticulosis in the left colon.                           - The examination was otherwise normal on direct                            and retroflexion views. Recommendation:           - Patient has a contact number available for                             emergencies. The signs and symptoms of potential                            delayed complications were discussed with the                            patient. Return to normal activities tomorrow.                            Written discharge instructions were provided to the                            patient.                           - Resume previous diet.                           - Continue present medications.                           -  Await pathology results. Milus Banister, MD 09/25/2019 10:46:03 AM This report has been signed electronically.

## 2019-09-25 NOTE — Progress Notes (Signed)
PT taken to PACU. Monitors in place. VSS. Report given to RN. 

## 2019-09-27 ENCOUNTER — Telehealth: Payer: Self-pay | Admitting: *Deleted

## 2019-09-27 NOTE — Telephone Encounter (Signed)
1. Have you developed a fever since your procedure? no  2.   Have you had an respiratory symptoms (SOB or cough) since your procedure? no  3.   Have you tested positive for COVID 19 since your procedure no  4.   Have you had any family members/close contacts diagnosed with the COVID 19 since your procedure?  no   If yes to any of these questions please route to Joylene John, RN and Joella Prince, RN Follow up Call-  Call back number 09/25/2019  Post procedure Call Back phone  # [phone]  Permission to leave phone message Yes  Some recent data might be hidden     Patient questions:  Do you have a fever, pain , or abdominal swelling? No. Pain Score  0 *  Have you tolerated food without any problems? No.  Have you been able to return to your normal activities? Yes.    Do you have any questions about your discharge instructions: Diet   No. Medications  No. Follow up visit  No.  Do you have questions or concerns about your Care? No.  Actions: * If pain score is 4 or above: No action needed, pain <4.

## 2019-09-27 NOTE — Telephone Encounter (Signed)
entered in error   

## 2019-09-30 DIAGNOSIS — H353132 Nonexudative age-related macular degeneration, bilateral, intermediate dry stage: Secondary | ICD-10-CM | POA: Diagnosis not present

## 2019-10-02 ENCOUNTER — Encounter: Payer: Self-pay | Admitting: Gastroenterology

## 2019-10-09 DIAGNOSIS — Z23 Encounter for immunization: Secondary | ICD-10-CM | POA: Diagnosis not present

## 2019-10-30 DIAGNOSIS — H353132 Nonexudative age-related macular degeneration, bilateral, intermediate dry stage: Secondary | ICD-10-CM | POA: Diagnosis not present

## 2019-11-05 ENCOUNTER — Other Ambulatory Visit: Payer: Self-pay

## 2019-11-05 ENCOUNTER — Ambulatory Visit
Admission: RE | Admit: 2019-11-05 | Discharge: 2019-11-05 | Disposition: A | Discharge status: Home / Self Care | Payer: Medicare Other | Source: Ambulatory Visit | Attending: Internal Medicine | Admitting: Internal Medicine

## 2019-11-05 DIAGNOSIS — Z1231 Encounter for screening mammogram for malignant neoplasm of breast: Secondary | ICD-10-CM | POA: Diagnosis not present

## 2019-11-08 ENCOUNTER — Other Ambulatory Visit: Payer: Self-pay | Admitting: Internal Medicine

## 2019-11-08 DIAGNOSIS — N63 Unspecified lump in unspecified breast: Secondary | ICD-10-CM

## 2019-11-16 ENCOUNTER — Ambulatory Visit
Admission: RE | Admit: 2019-11-16 | Discharge: 2019-11-16 | Disposition: A | Discharge status: Home / Self Care | Payer: Medicare Other | Source: Ambulatory Visit | Attending: Internal Medicine | Admitting: Internal Medicine

## 2019-11-16 ENCOUNTER — Other Ambulatory Visit: Payer: Self-pay

## 2019-11-16 ENCOUNTER — Other Ambulatory Visit: Payer: Self-pay | Admitting: Internal Medicine

## 2019-11-16 DIAGNOSIS — R928 Other abnormal and inconclusive findings on diagnostic imaging of breast: Secondary | ICD-10-CM

## 2019-11-16 DIAGNOSIS — N6489 Other specified disorders of breast: Secondary | ICD-10-CM | POA: Diagnosis not present

## 2019-11-16 DIAGNOSIS — N63 Unspecified lump in unspecified breast: Secondary | ICD-10-CM

## 2019-11-26 DIAGNOSIS — E7800 Pure hypercholesterolemia, unspecified: Secondary | ICD-10-CM | POA: Diagnosis not present

## 2019-11-26 DIAGNOSIS — M109 Gout, unspecified: Secondary | ICD-10-CM | POA: Diagnosis not present

## 2019-11-27 ENCOUNTER — Other Ambulatory Visit: Payer: Self-pay | Admitting: Internal Medicine

## 2019-11-27 ENCOUNTER — Other Ambulatory Visit: Payer: Self-pay

## 2019-11-27 ENCOUNTER — Ambulatory Visit
Admission: RE | Admit: 2019-11-27 | Discharge: 2019-11-27 | Disposition: A | Discharge status: Home / Self Care | Payer: Medicare Other | Source: Ambulatory Visit | Attending: Internal Medicine | Admitting: Internal Medicine

## 2019-11-27 DIAGNOSIS — R928 Other abnormal and inconclusive findings on diagnostic imaging of breast: Secondary | ICD-10-CM

## 2019-11-27 DIAGNOSIS — N6012 Diffuse cystic mastopathy of left breast: Secondary | ICD-10-CM | POA: Diagnosis not present

## 2019-11-27 DIAGNOSIS — N6323 Unspecified lump in the left breast, lower outer quadrant: Secondary | ICD-10-CM | POA: Diagnosis not present

## 2019-11-27 DIAGNOSIS — N6321 Unspecified lump in the left breast, upper outer quadrant: Secondary | ICD-10-CM | POA: Diagnosis not present

## 2019-11-29 DIAGNOSIS — H353132 Nonexudative age-related macular degeneration, bilateral, intermediate dry stage: Secondary | ICD-10-CM | POA: Diagnosis not present

## 2019-12-03 DIAGNOSIS — Z23 Encounter for immunization: Secondary | ICD-10-CM | POA: Diagnosis not present

## 2019-12-03 DIAGNOSIS — R82998 Other abnormal findings in urine: Secondary | ICD-10-CM | POA: Diagnosis not present

## 2019-12-03 DIAGNOSIS — F321 Major depressive disorder, single episode, moderate: Secondary | ICD-10-CM | POA: Diagnosis not present

## 2019-12-03 DIAGNOSIS — M109 Gout, unspecified: Secondary | ICD-10-CM | POA: Diagnosis not present

## 2019-12-03 DIAGNOSIS — Z6836 Body mass index (BMI) 36.0-36.9, adult: Secondary | ICD-10-CM | POA: Diagnosis not present

## 2019-12-03 DIAGNOSIS — Z0000 Encounter for general adult medical examination without abnormal findings: Secondary | ICD-10-CM | POA: Diagnosis not present

## 2019-12-03 DIAGNOSIS — M179 Osteoarthritis of knee, unspecified: Secondary | ICD-10-CM | POA: Diagnosis not present

## 2019-12-03 DIAGNOSIS — E669 Obesity, unspecified: Secondary | ICD-10-CM | POA: Diagnosis not present

## 2019-12-03 DIAGNOSIS — E7800 Pure hypercholesterolemia, unspecified: Secondary | ICD-10-CM | POA: Diagnosis not present

## 2019-12-03 DIAGNOSIS — Z853 Personal history of malignant neoplasm of breast: Secondary | ICD-10-CM | POA: Diagnosis not present

## 2019-12-03 DIAGNOSIS — Z8542 Personal history of malignant neoplasm of other parts of uterus: Secondary | ICD-10-CM | POA: Diagnosis not present

## 2019-12-03 DIAGNOSIS — D126 Benign neoplasm of colon, unspecified: Secondary | ICD-10-CM | POA: Diagnosis not present

## 2019-12-03 DIAGNOSIS — I10 Essential (primary) hypertension: Secondary | ICD-10-CM | POA: Diagnosis not present

## 2019-12-29 DIAGNOSIS — H353132 Nonexudative age-related macular degeneration, bilateral, intermediate dry stage: Secondary | ICD-10-CM | POA: Diagnosis not present

## 2020-03-16 ENCOUNTER — Other Ambulatory Visit: Payer: Self-pay | Admitting: Internal Medicine

## 2020-03-16 DIAGNOSIS — N641 Fat necrosis of breast: Secondary | ICD-10-CM

## 2020-05-27 ENCOUNTER — Ambulatory Visit: Admission: RE | Admit: 2020-05-27 | Payer: Medicare Other | Source: Ambulatory Visit

## 2020-05-27 ENCOUNTER — Ambulatory Visit
Admission: RE | Admit: 2020-05-27 | Discharge: 2020-05-27 | Disposition: A | Discharge status: Home / Self Care | Payer: Medicare Other | Source: Ambulatory Visit | Attending: Internal Medicine | Admitting: Internal Medicine

## 2020-05-27 ENCOUNTER — Other Ambulatory Visit: Payer: Self-pay

## 2020-05-27 DIAGNOSIS — N641 Fat necrosis of breast: Secondary | ICD-10-CM

## 2020-08-12 ENCOUNTER — Other Ambulatory Visit: Payer: Self-pay | Admitting: Internal Medicine

## 2020-08-12 DIAGNOSIS — Z1231 Encounter for screening mammogram for malignant neoplasm of breast: Secondary | ICD-10-CM

## 2020-11-05 ENCOUNTER — Ambulatory Visit
Admission: RE | Admit: 2020-11-05 | Discharge: 2020-11-05 | Disposition: A | Discharge status: Home / Self Care | Payer: Medicare Other | Source: Ambulatory Visit | Attending: Internal Medicine | Admitting: Internal Medicine

## 2020-11-05 ENCOUNTER — Other Ambulatory Visit: Payer: Self-pay

## 2020-11-05 DIAGNOSIS — Z1231 Encounter for screening mammogram for malignant neoplasm of breast: Secondary | ICD-10-CM

## 2021-06-19 IMAGING — US US BREAST BX W LOC DEV 1ST LESION IMG BX SPEC US GUIDE*L*
1 series · 12 of 18 positions shown · non-contrast
Comparison: Previous exam(s).
COMPARISON: Previous exam(s).

Addendum:
CLINICAL DATA: 79-year-old female with 2 suspicious left breast
masses.

EXAM:
ULTRASOUND GUIDED LEFT BREAST CORE NEEDLE BIOPSY x2

[Series 1: us breast bx w loc dev 1st lesion img bx spec us g · 0.06mm/px · 12 of 18 slices shown]
[im 1/18]
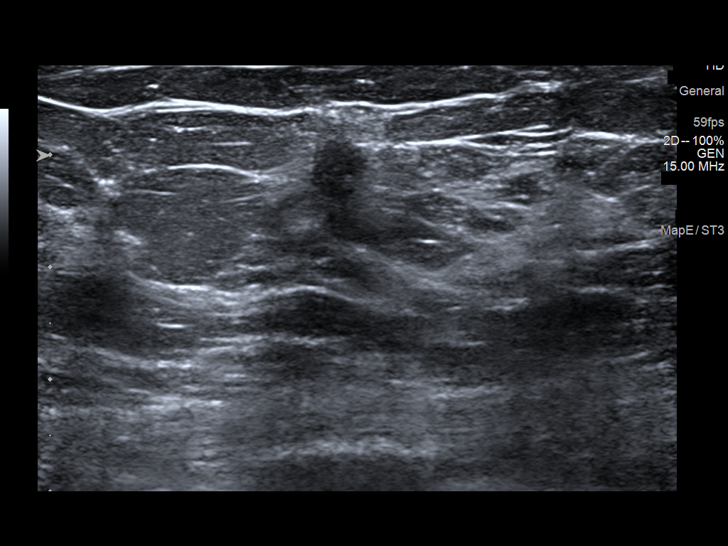
[im 3/18]
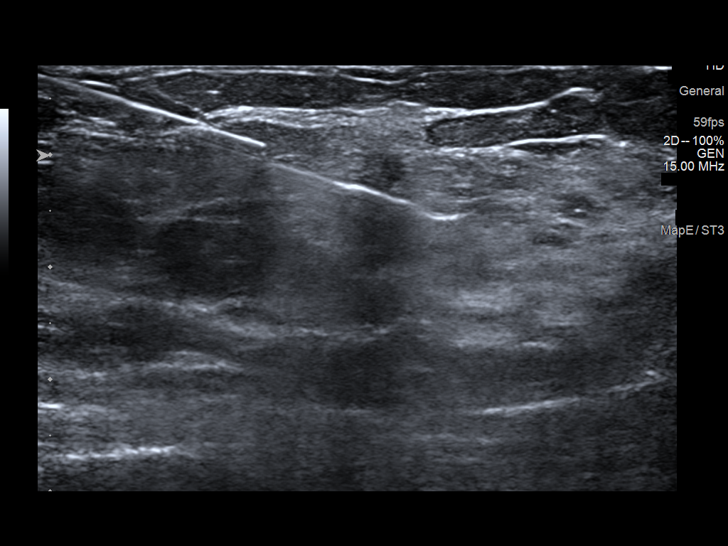
[im 4/18]
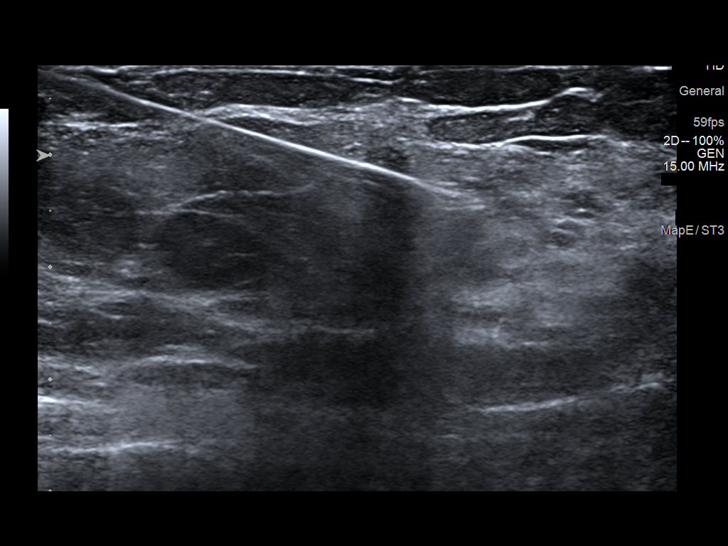
[im 6/18]
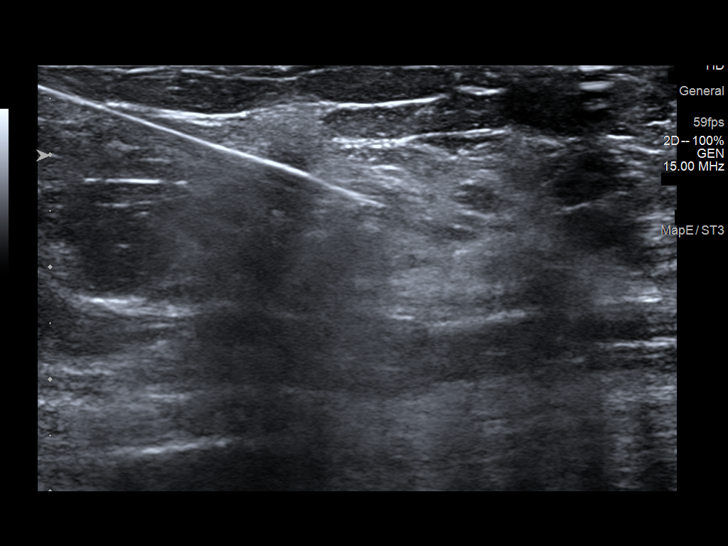
[im 7/18]
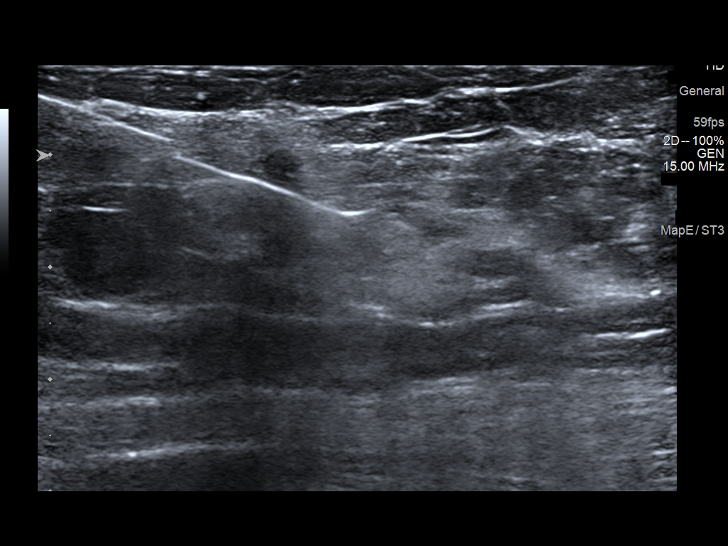
[im 9/18]
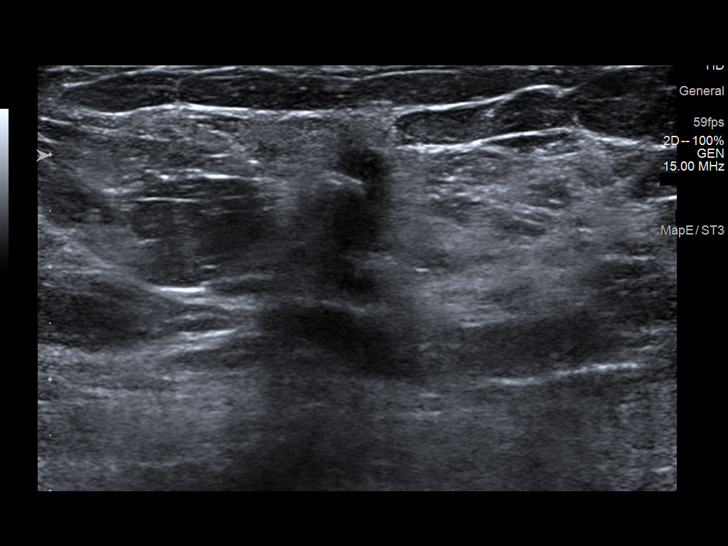
[im 10/18]
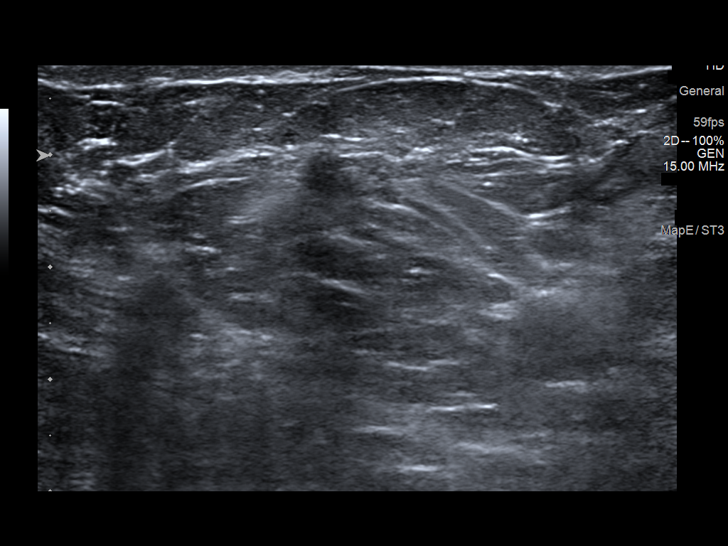
[im 12/18]
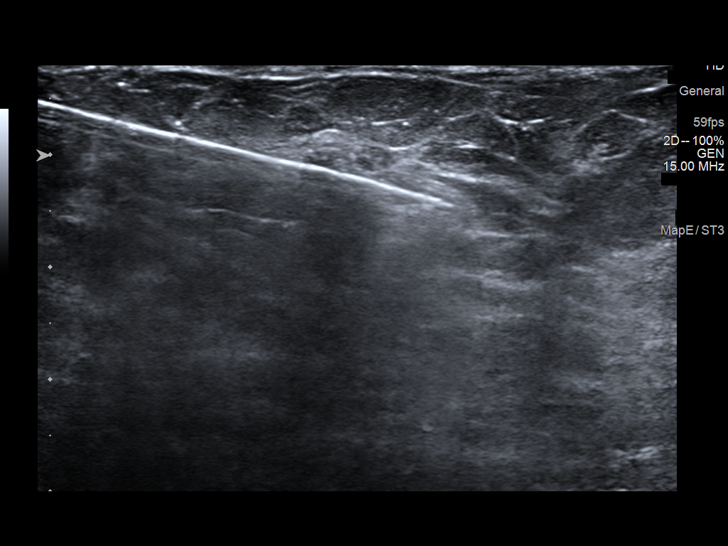
[im 13/18]
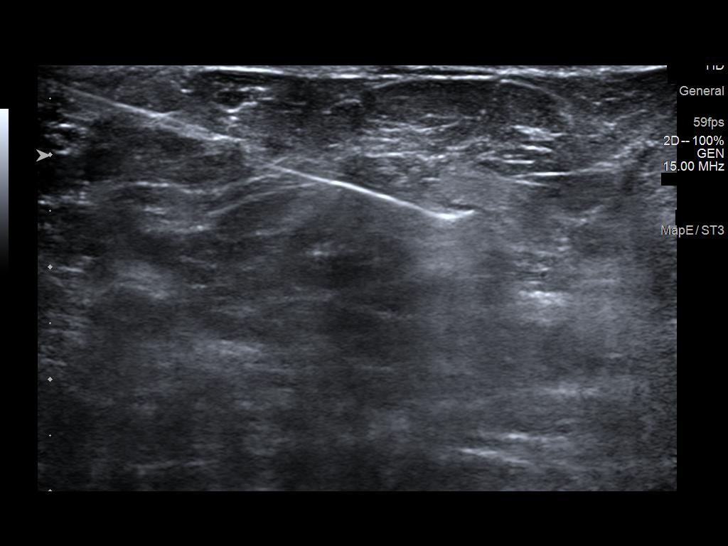
[im 15/18]
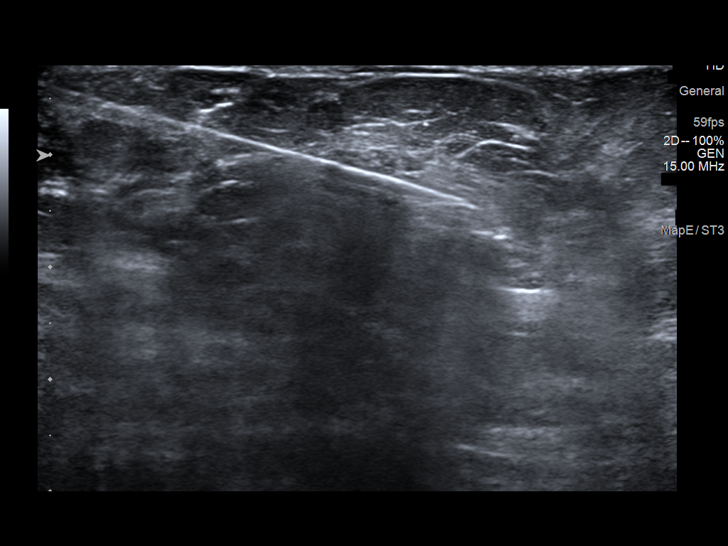
[im 16/18]
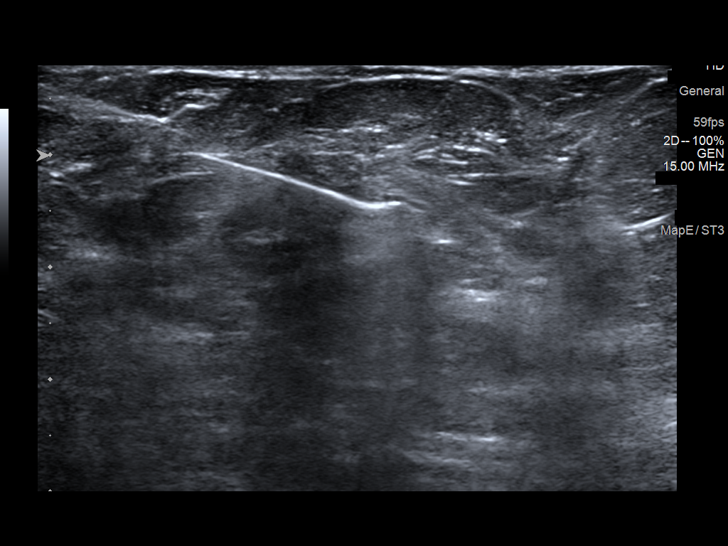
[im 18/18]
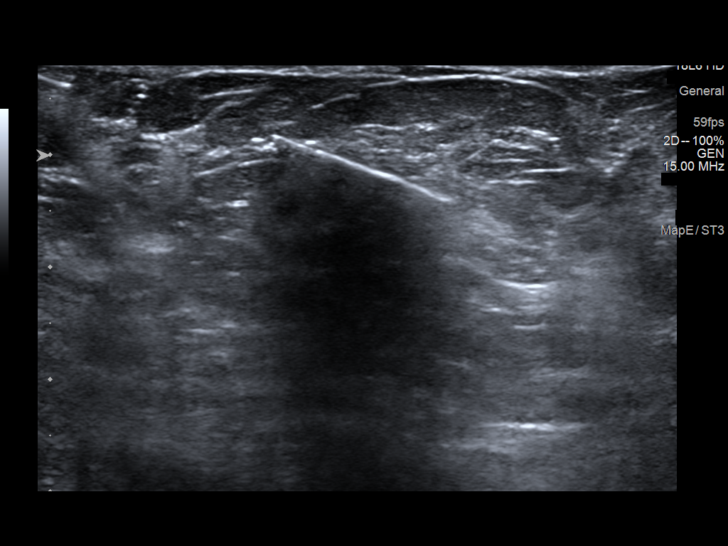

[12 of 18 positions shown; findings below may reference images not displayed]



Lesion quadrant: Upper outer quadrant

Using sterile technique and 1% Lidocaine as local anesthetic, under
direct ultrasound visualization, a 14 gauge Joshjax device was
used to perform biopsy of a mass at the 3 o'clock position using a
inferior approach. At the conclusion of the procedure a q shaped
tissue marker clip was deployed into the biopsy cavity.

Lesion quadrant: Lower outer quadrant

Using sterile technique and 1% Lidocaine as local anesthetic, under
direct ultrasound visualization, a 14 gauge Joshjax device was
used to perform biopsy of a mass at the 5 o'clock position using a
inferior approach. At the conclusion of the procedure a tribell
tissue marker clip was deployed into the biopsy cavity.

Follow up 2 view mammogram was performed and dictated separately.
IMPRESSION: Ultrasound guided biopsy of the left breast x2. No apparent
complications.

ADDENDUM:
Pathology revealed HEALING FAT NECROSIS WITH CHRONIC INFLAMMATION of
the Left breast, both locations, 3 o'clock, 4cmfn and 5 o'clock,
8cmfn. This was found to be concordant by Dr. Rg Tor.

Pathology results were discussed with the patient by telephone. The
patient reported doing well after the biopsies with tenderness and
bruising at the sites. Post biopsy instructions and care were
reviewed and questions were answered. The patient was encouraged to
call The [REDACTED] for any additional
concerns. My direct phone number was provided.

The patient was asked to return for Left diagnostic mammography and
ultrasound in 6 months and informed a reminder notice would be sent
regarding this appointment.

Pathology results reported by Nakazato Duran, RN on 12/02/2019.



Lesion quadrant: Upper outer quadrant

Using sterile technique and 1% Lidocaine as local anesthetic, under
direct ultrasound visualization, a 14 gauge Joshjax device was
used to perform biopsy of a mass at the 3 o'clock position using a
inferior approach. At the conclusion of the procedure a q shaped
tissue marker clip was deployed into the biopsy cavity.

Lesion quadrant: Lower outer quadrant

Using sterile technique and 1% Lidocaine as local anesthetic, under
direct ultrasound visualization, a 14 gauge Joshjax device was
used to perform biopsy of a mass at the 5 o'clock position using a
inferior approach. At the conclusion of the procedure a tribell
tissue marker clip was deployed into the biopsy cavity.

Follow up 2 view mammogram was performed and dictated separately.
IMPRESSION: Ultrasound guided biopsy of the left breast x2. No apparent
complications.

## 2021-07-05 ENCOUNTER — Other Ambulatory Visit: Payer: Self-pay | Admitting: Internal Medicine

## 2021-07-05 DIAGNOSIS — Z1231 Encounter for screening mammogram for malignant neoplasm of breast: Secondary | ICD-10-CM

## 2021-11-08 ENCOUNTER — Ambulatory Visit
Admission: RE | Admit: 2021-11-08 | Discharge: 2021-11-08 | Disposition: A | Discharge status: Home / Self Care | Payer: Medicare Other | Source: Ambulatory Visit | Attending: Internal Medicine | Admitting: Internal Medicine

## 2021-11-08 DIAGNOSIS — Z1231 Encounter for screening mammogram for malignant neoplasm of breast: Secondary | ICD-10-CM

## 2021-11-10 ENCOUNTER — Other Ambulatory Visit: Payer: Self-pay | Admitting: Internal Medicine

## 2021-11-10 DIAGNOSIS — R928 Other abnormal and inconclusive findings on diagnostic imaging of breast: Secondary | ICD-10-CM

## 2021-11-24 ENCOUNTER — Other Ambulatory Visit: Payer: Self-pay | Admitting: Internal Medicine

## 2021-11-24 ENCOUNTER — Ambulatory Visit
Admission: RE | Admit: 2021-11-24 | Discharge: 2021-11-24 | Disposition: A | Discharge status: Home / Self Care | Payer: Medicare Other | Source: Ambulatory Visit | Attending: Internal Medicine | Admitting: Internal Medicine

## 2021-11-24 DIAGNOSIS — R928 Other abnormal and inconclusive findings on diagnostic imaging of breast: Secondary | ICD-10-CM

## 2021-11-24 DIAGNOSIS — N6320 Unspecified lump in the left breast, unspecified quadrant: Secondary | ICD-10-CM

## 2021-12-01 ENCOUNTER — Ambulatory Visit
Admission: RE | Admit: 2021-12-01 | Discharge: 2021-12-01 | Disposition: A | Discharge status: Home / Self Care | Payer: Medicare Other | Source: Ambulatory Visit | Attending: Internal Medicine | Admitting: Internal Medicine

## 2021-12-01 DIAGNOSIS — N6320 Unspecified lump in the left breast, unspecified quadrant: Secondary | ICD-10-CM

## 2021-12-01 HISTORY — PX: BREAST BIOPSY: SHX20

## 2022-07-21 ENCOUNTER — Other Ambulatory Visit: Payer: Self-pay | Admitting: Internal Medicine

## 2022-07-21 DIAGNOSIS — Z1231 Encounter for screening mammogram for malignant neoplasm of breast: Secondary | ICD-10-CM

## 2022-09-14 ENCOUNTER — Encounter: Payer: Self-pay | Admitting: Gastroenterology

## 2022-09-14 ENCOUNTER — Ambulatory Visit (INDEPENDENT_AMBULATORY_CARE_PROVIDER_SITE_OTHER): Payer: Medicare Other | Admitting: Gastroenterology

## 2022-09-14 VITALS — BP 132/68 | HR 70 | Wt 217.0 lb

## 2022-09-14 DIAGNOSIS — Z86010 Personal history of colonic polyps: Secondary | ICD-10-CM

## 2022-09-14 DIAGNOSIS — D123 Benign neoplasm of transverse colon: Secondary | ICD-10-CM | POA: Diagnosis not present

## 2022-09-14 NOTE — Patient Instructions (Addendum)
_______________________________________________________  If your blood pressure at your visit was 140/90 or greater, please contact your primary care physician to follow up on this. _______________________________________________________  If you are age 80 or older, your body mass index should be between 23-30. Your Body mass index is 35.02 kg/m. If this is out of the aforementioned range listed, please consider follow up with your Primary Care Provider. _______________________________________________________  The  GI providers would like to encourage you to use [facility] to communicate with providers for non-urgent requests or questions.  Due to long hold times on the telephone, sending your provider a message by [facility] may be a faster and more efficient way to get a response.  Please allow 48 business hours for a response.  Please remember that this is for non-urgent requests.  _______________________________________________________  Kiara Rose will follow up in our office on an as needed basis.  Thank you for entrusting me with your care and choosing [facility].  [place], PA-C

## 2022-09-14 NOTE — Progress Notes (Signed)
Chief Complaint: Colonoscopy recall Primary GI MD: (Dr. Christella Hartigan)  HPI: 82 year old female history of breast cancer (2001), uterine cancer (1995), tubular adenomas, presents for colonoscopy recall.  Last colonoscopy 09/2019 with Dr. Christella Hartigan showed 4 small tubular adenomas.  Previous colonoscopies include 2018 and 2008.  Patient denies GI symptoms.  Has occasional constipation if she does not eat enough fiber which she controls with increasing her dietary fiber.  Denies melena/hematochezia.  Gets a yearly Hemoccult with her PCP that has always been negative.  Denies abdominal pain.  Denies nausea/vomiting.  Denies family history of colon cancer.  She does report of history of hemorrhoids from pregnancy many years ago that occasionally flareup.  She has not had a flareup in a long time.  PREVIOUS GI WORKUP    Colonoscopy 09/2019 for history of polyps - Four 3 to 7 mm tubular adenomas in the sigmoid colon, in the transverse colon and at the hepatic flexure, removed with a cold snare. Resected and retrieved.  - Diverticulosis in the left colon.  - The examination was otherwise normal on direct and retroflexion views.  Past Medical History:  Diagnosis Date   Allergy    Anxiety    Arthritis    Cancer (HCC) 2001, 1995   uterine cancer, 1995, breast cancer 2001   Constipation    Come snad Goes and handles with Diet    GERD (gastroesophageal reflux disease)    OCC    History of radiation therapy    2001   Hyperlipidemia    Hypertension    Shingles     Past Surgical History:  Procedure Laterality Date   APPENDECTOMY  1966   BREAST BIOPSY Left 12/01/2021   [place] LT BREAST BX W LOC DEV 1ST LESION IMG BX SPEC US GUIDE 12/01/2021 [facility]   BREAST LUMPECTOMY Left 2001   TOTAL ABDOMINAL HYSTERECTOMY  1995    Current Outpatient Medications  Medication Sig Dispense Refill   allopurinol (ZYLOPRIM) 100 MG tablet Take 100 mg by mouth daily. Takes 2 tablets once daily     aspirin  325 MG tablet Take 325 mg by mouth daily.     calcium-vitamin D 250-100 MG-UNIT tablet Take 1 tablet by mouth 2 (two) times daily.     citalopram (CELEXA) 20 MG tablet Take 20 mg by mouth daily.     fexofenadine (ALLEGRA) 180 MG tablet Take 180 mg by mouth daily.     fluticasone (FLONASE) 50 MCG/ACT nasal spray Place into both nostrils daily.     furosemide (LASIX) 20 MG tablet Take 20 mg by mouth daily.     ibuprofen (ADVIL,MOTRIN) 800 MG tablet Take 800 mg by mouth every 8 (eight) hours as needed.     irbesartan-hydrochlorothiazide (AVALIDE) 300-12.5 MG tablet Take 1 tablet by mouth daily.     montelukast (SINGULAIR) 10 MG tablet Take 10 mg by mouth at bedtime.     Multiple Vitamins-Minerals (EYE VITAMINS PO) Take by mouth 2 (two) times daily. MacProtect eye vitamin     Omega-3 Fatty Acids (FISH OIL) 1200 MG CAPS Take by mouth 2 (two) times daily.     potassium chloride SA (K-DUR,KLOR-CON) 20 MEQ tablet Take 20 mEq by mouth 2 (two) times daily.     simvastatin (ZOCOR) 40 MG tablet Take 40 mg by mouth daily.     gabapentin (NEURONTIN) 300 MG capsule Take 300 mg by mouth 2 (two) times daily as needed. (Patient not taking: Reported on 09/11/2019)     valACYclovir (VALTREX)  1000 MG tablet Take 1,000 mg by mouth 3 (three) times daily. (Patient not taking: Reported on 09/11/2019)     No current facility-administered medications for this visit.    Allergies as of 09/14/2022   (No Known Allergies)    Family History  Problem Relation Age of Onset   Colon cancer Neg Hx    Colon polyps Neg Hx    Esophageal cancer Neg Hx    Rectal cancer Neg Hx    Stomach cancer Neg Hx     Social History   Socioeconomic History   Marital status: Married    Spouse name: Not on file   Number of children: Not on file   Years of education: Not on file   Highest education level: Not on file  Occupational History   Not on file  Tobacco Use   Smoking status: Never   Smokeless tobacco: Never  Vaping Use    Vaping status: Never Used  Substance and Sexual Activity   Alcohol use: No   Drug use: No   Sexual activity: Not on file  Other Topics Concern   Not on file  Social History Narrative   Not on file   Social Determinants of Health   Financial Resource Strain: Not on file  Food Insecurity: Not on file  Transportation Needs: Not on file  Physical Activity: Not on file  Stress: Not on file  Social Connections: Not on file  Intimate Partner Violence: Not on file    Review of Systems:    Constitutional: No weight loss, fever, chills, weakness or fatigue HEENT: Eyes: No change in vision               Ears, Nose, Throat:  No change in hearing or congestion Skin: No rash or itching Cardiovascular: No chest pain, chest pressure or palpitations   Respiratory: No SOB or cough Gastrointestinal: See HPI and otherwise negative Genitourinary: No dysuria or change in urinary frequency Neurological: No headache, dizziness or syncope Musculoskeletal: No new muscle or joint pain Hematologic: No bleeding or bruising Psychiatric: No history of depression or anxiety    Physical Exam:  Vital signs: BP 132/68   Pulse 70   Wt 98.4 kg   BMI 35.02 kg/m   Constitutional: NAD, Well developed, Well nourished, alert and cooperative Head:  Normocephalic and atraumatic. Eyes:   PEERL, EOMI. No icterus. Conjunctiva pink. Respiratory: Respirations even and unlabored. Lungs clear to auscultation bilaterally.   No wheezes, crackles, or rhonchi.  Cardiovascular:  Regular rate and rhythm. No peripheral edema, cyanosis or pallor.  Rectal:  Not performed.  Msk:  Symmetrical without gross deformities. Without edema, no deformity or joint abnormality.  Neurologic:  Alert and  oriented x4;  grossly normal neurologically.  Skin:   Dry and intact without significant lesions or rashes. Psychiatric: Oriented to person, place and time. Demonstrates good judgement and reason without abnormal affect or  behaviors.   RELEVANT LABS AND IMAGING: CBC    Component Value Date/Time   WBC 8.0 06/07/2017 1027   WBC 8.3 12/05/2016 1424   RBC 4.21 06/07/2017 1027   HGB 13.5 06/07/2017 1027   HGB 13.2 12/05/2016 1424   HCT 39.9 06/07/2017 1027   HCT 38.8 12/05/2016 1424   PLT 199 06/07/2017 1027   PLT 206 12/05/2016 1424   MCV 94.8 06/07/2017 1027   MCV 96.3 12/05/2016 1424   MCH 32.1 06/07/2017 1027   MCHC 33.8 06/07/2017 1027   RDW 13.3 06/07/2017 1027  RDW 13.0 12/05/2016 1424   LYMPHSABS 2.4 06/07/2017 1027   LYMPHSABS 2.3 12/05/2016 1424   MONOABS 0.7 06/07/2017 1027   MONOABS 0.7 12/05/2016 1424   EOSABS 0.3 06/07/2017 1027   EOSABS 0.2 12/05/2016 1424   BASOSABS 0.0 06/07/2017 1027   BASOSABS 0.0 12/05/2016 1424    CMP     Component Value Date/Time   NA 136 06/07/2017 1027   NA 139 12/05/2016 1424   K 4.1 06/07/2017 1027   K 4.2 12/05/2016 1424   CL 100 06/07/2017 1027   CO2 26 06/07/2017 1027   CO2 28 12/05/2016 1424   GLUCOSE 93 06/07/2017 1027   GLUCOSE 96 12/05/2016 1424   BUN 21 06/07/2017 1027   BUN 24.6 12/05/2016 1424   CREATININE 0.88 06/07/2017 1027   CREATININE 1.1 12/05/2016 1424   CALCIUM 9.3 06/07/2017 1027   CALCIUM 9.4 12/05/2016 1424   PROT 6.6 06/07/2017 1027   PROT 7.0 12/05/2016 1424   ALBUMIN 3.8 06/07/2017 1027   ALBUMIN 4.0 12/05/2016 1424   AST 26 06/07/2017 1027   AST 27 12/05/2016 1424   ALT 20 06/07/2017 1027   ALT 21 12/05/2016 1424   ALKPHOS 83 06/07/2017 1027   ALKPHOS 79 12/05/2016 1424   BILITOT 0.6 06/07/2017 1027   BILITOT 0.37 12/05/2016 1424   GFRNONAA >60 06/07/2017 1027   GFRAA >60 06/07/2017 1027     Assessment/Plan:   History of colonic polyps Last colonoscopy in 2021 with 4 small tubular adenomas without dysplasia.  Extensive discussion with patient about risks versus benefits with colonoscopy.  I think she appears to be in great health for her age and does not have a significant amount of comorbidities that  would prevent her from getting a colonoscopy.  Reassuringly patient has had yearly negative Hemoccults.  Patient noted she gets stressed with driving from [place] and going through the prep for a colonoscopy and ultimately decided that she would prefer to avoid colonoscopy unless problems arise in the future.  I think this is reasonable with her age and absence of symptoms especially with her reassuring annual stool test with PCP.   Lara Mulch [facility] 09/14/2022, 11:07 AM  Cc: Gaspar Garbe, MD

## 2022-09-18 NOTE — Progress Notes (Signed)
Attending Physician's Attestation   I have reviewed the chart.   I agree with the Advanced Practitioner's note, impression, and recommendations with any updates as below. As patient now has had a significant discussion about the risks and benefits, it is reasonable to individualize her screening/surveillance.  If she feels she is no longer in need of colonoscopies, we will respect her wishes.  She certainly can reach out to [place] if there is any development of any new GI symptoms that may require/necessitate a repeat colonoscopy.  Further recalls can be removed from the system.   Corliss Parish, MD [facility] Advanced Endoscopy Office # [phone]

## 2022-09-20 ENCOUNTER — Encounter: Payer: Self-pay | Admitting: Gastroenterology

## 2022-11-11 ENCOUNTER — Ambulatory Visit
Admission: RE | Admit: 2022-11-11 | Discharge: 2022-11-11 | Disposition: A | Discharge status: Home / Self Care | Payer: Medicare Other | Source: Ambulatory Visit | Attending: Internal Medicine | Admitting: Internal Medicine

## 2022-11-11 DIAGNOSIS — Z1231 Encounter for screening mammogram for malignant neoplasm of breast: Secondary | ICD-10-CM

## 2023-06-14 ENCOUNTER — Other Ambulatory Visit: Payer: Self-pay | Admitting: Internal Medicine

## 2023-06-14 DIAGNOSIS — Z1231 Encounter for screening mammogram for malignant neoplasm of breast: Secondary | ICD-10-CM

## 2023-11-13 ENCOUNTER — Ambulatory Visit
Admission: RE | Admit: 2023-11-13 | Discharge: 2023-11-13 | Disposition: A | Source: Ambulatory Visit | Attending: Internal Medicine | Admitting: Internal Medicine

## 2023-11-13 DIAGNOSIS — Z1231 Encounter for screening mammogram for malignant neoplasm of breast: Secondary | ICD-10-CM
